# Patient Record
Sex: Female | Born: 1990 | Race: Black or African American | Hispanic: No | Marital: Single | Smoking: Former smoker
Health system: Southern US, Community
[De-identification: ages and names within clinical notes are randomized; demographics above are authoritative.]

---
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
## 2011-02-25 ENCOUNTER — Other Ambulatory Visit: Payer: Self-pay | Admitting: Neurology

## 2011-02-27 ENCOUNTER — Inpatient Hospital Stay: Admission: RE | Admit: 2011-02-27 | Payer: 59 | Source: Ambulatory Visit

## 2011-03-04 ENCOUNTER — Ambulatory Visit
Admission: RE | Admit: 2011-03-04 | Discharge: 2011-03-04 | Disposition: A | Discharge status: Home / Self Care | Payer: 59 | Source: Ambulatory Visit | Attending: Neurology | Admitting: Neurology

## 2011-03-16 ENCOUNTER — Ambulatory Visit: Payer: 59 | Admitting: Emergency Medicine

## 2011-03-25 ENCOUNTER — Encounter: Payer: Self-pay | Admitting: Emergency Medicine

## 2011-03-25 ENCOUNTER — Ambulatory Visit (INDEPENDENT_AMBULATORY_CARE_PROVIDER_SITE_OTHER): Payer: 59 | Admitting: Emergency Medicine

## 2011-03-25 DIAGNOSIS — Z0000 Encounter for general adult medical examination without abnormal findings: Secondary | ICD-10-CM | POA: Insufficient documentation

## 2011-03-25 NOTE — Progress Notes (Signed)
  Subjective:    Patient ID: Stacy Phillips, female    DOB: 12/15/1990, 21 y.o.   MRN: [id number]  HPI Stacy Phillips is here today to establish care.  She does not have any acute concerns.  I have reviewed and updated the following as appropriate: allergies, current medications, past family history, past medical history, past social history, past surgical history and problem list.   Review of Systems Negative except as in HPI.    Objective:   Physical Exam Vitals: reviewed General: pleasant and cooperative young woman, speaks somewhat quickly, tattoos and piercing's present. HEENT: AT/[place], sclera white, EOMI, PERRL, MMM, no pharyngeal erythema or exudate Neck: no LAD, thyroid normal, trachea midline CV: RRR, no murmurs Pulm: CTAB, no wheezes, rales Abd: +BS, soft, non-tender, mildly obese Ext: no edema, good pulses Skin: no rashes or lesions Neuro: gait normal, PERRL     Assessment & Plan:

## 2011-03-25 NOTE — Patient Instructions (Signed)
It was nice to meet you.  If you do not have a period in the next 3 months, I want to see you back in clinic.

## 2011-03-25 NOTE — Assessment & Plan Note (Signed)
Doing well.  Patient taking a medication for herpes; will call office with name and dose of medication.

## 2011-05-11 ENCOUNTER — Ambulatory Visit (INDEPENDENT_AMBULATORY_CARE_PROVIDER_SITE_OTHER): Payer: 59 | Admitting: Family Medicine

## 2011-05-11 ENCOUNTER — Encounter: Payer: Self-pay | Admitting: Family Medicine

## 2011-05-11 VITALS — BP 116/73 | HR 66 | Ht 66.14 in | Wt 173.0 lb

## 2011-05-11 DIAGNOSIS — H109 Unspecified conjunctivitis: Secondary | ICD-10-CM

## 2011-05-11 MED ORDER — POLYMYXIN B-TRIMETHOPRIM 10000-0.1 UNIT/ML-% OP SOLN: 2.0000 [drp] | Freq: Four times a day (QID) | OPHTHALMIC | Status: AC

## 2011-05-11 NOTE — Assessment & Plan Note (Signed)
Symptoms consistent with bacterial infection of right eye. Significant amount of yellow discharge.  Will prescribe antibiotic drops.  Pt to return as needed. Encouraged good hand washing to prevent further spread.

## 2011-05-11 NOTE — Progress Notes (Signed)
  Subjective:    Patient ID: Stacy Phillips, female    DOB: 1990/07/11, 21 y.o.   MRN: [id number]  HPI Right eye redness: Patient reports right eye redness for 2-3 days. Positive yellow discharge. As soon as she cleans out the yellow discharge more discharge will appear. Positive itching and discomfort of thigh. No problems with vision. Has been using warm compresses to eye to help discomfort. Also feels like the eyelids seem sore. Positive sick contact with similar symptoms. No fever. No headache. No rash.    Review of Systems As per above.    Objective:   Physical Exam  HENT:  Head: Normocephalic and atraumatic.  Eyes: EOM are normal. Pupils are equal, round, and reactive to light. Right eye exhibits discharge.       Right conjunctiva red, hypervascular.  + yellow discharge at lid margin.   Cardiovascular: Normal rate.   Pulmonary/Chest: Effort normal.          Assessment & Plan:

## 2011-05-12 ENCOUNTER — Other Ambulatory Visit: Payer: Self-pay | Admitting: Family Medicine

## 2011-05-12 ENCOUNTER — Telehealth: Payer: Self-pay | Admitting: Emergency Medicine

## 2011-05-12 MED ORDER — ERYTHROMYCIN 5 MG/GM OP OINT: TOPICAL_OINTMENT | Freq: Four times a day (QID) | OPHTHALMIC | Status: AC

## 2011-05-12 NOTE — Telephone Encounter (Signed)
Called patient and informed of Rx sent to pharmacy.[place], [place]   

## 2011-05-12 NOTE — Telephone Encounter (Signed)
Was given eye drops yesterday and it says on bottle sulfate and is allergic to sulfur drugs.  Wants to know if this has sulfur in it

## 2011-05-12 NOTE — Telephone Encounter (Signed)
I have sent into pharmacy, erythromycin ointment, this does not have sulfa or pcn in it.  Please call pt and let her know that this has been sent to the pharmacy.  If eye continues to get worse should come in for recheck.

## 2011-05-12 NOTE — Telephone Encounter (Signed)
Returned call to patient.  She has used eye drops and "not sure if eye is worse."  States it appears to be more swollen at the top.  Patient is allergic to sulf and per pharmacist---should d/c eye drops due to sulfa allergy and change to new med.  Will route note to Dr. Edmonia James to change to new Rx for eye drops.  Will call patient back.  Gaylene Brooks, RN

## 2011-06-14 ENCOUNTER — Encounter: Payer: Self-pay | Admitting: Family Medicine

## 2011-06-14 ENCOUNTER — Ambulatory Visit (INDEPENDENT_AMBULATORY_CARE_PROVIDER_SITE_OTHER): Payer: 59 | Admitting: Family Medicine

## 2011-06-14 VITALS — BP 107/72 | HR 96 | Ht 66.0 in | Wt 172.0 lb

## 2011-06-14 DIAGNOSIS — L819 Disorder of pigmentation, unspecified: Secondary | ICD-10-CM

## 2011-06-14 DIAGNOSIS — A6000 Herpesviral infection of urogenital system, unspecified: Secondary | ICD-10-CM

## 2011-06-14 DIAGNOSIS — L709 Acne, unspecified: Secondary | ICD-10-CM

## 2011-06-14 DIAGNOSIS — L708 Other acne: Secondary | ICD-10-CM

## 2011-06-14 DIAGNOSIS — R300 Dysuria: Secondary | ICD-10-CM

## 2011-06-14 MED ORDER — METRONIDAZOLE 1 % EX GEL: Freq: Every day | CUTANEOUS | Status: AC

## 2011-06-14 MED ORDER — LIDOCAINE HCL 2 % EX GEL: CUTANEOUS | Status: AC | PRN

## 2011-06-14 MED ORDER — VALACYCLOVIR HCL 1 G PO TABS: 1000.0000 mg | ORAL_TABLET | Freq: Two times a day (BID) | ORAL | Status: AC

## 2011-06-14 MED ORDER — VALACYCLOVIR HCL 1 G PO TABS: 1000.0000 mg | ORAL_TABLET | Freq: Two times a day (BID) | ORAL | Status: DC

## 2011-06-14 MED ORDER — HYDROCODONE-ACETAMINOPHEN 5-500 MG PO TABS: 1.0000 | ORAL_TABLET | Freq: Four times a day (QID) | ORAL | Status: AC | PRN

## 2011-06-14 MED ORDER — METRONIDAZOLE 1 % EX GEL: Freq: Every day | CUTANEOUS | Status: DC

## 2011-06-14 NOTE — Progress Notes (Signed)
  Subjective:    Patient ID: Stacy Phillips, female    DOB: 1990/06/30, 21 y.o.   MRN: [id number]  HPI Comments: Has h/o HSV and now with similar sx's with outbreak and blisters.  Dysuria  This is a new problem. The current episode started yesterday. The problem occurs every urination. The problem has been gradually worsening. The quality of the pain is described as burning. The pain is moderate. There has been no fever. She is sexually active. There is no history of pyelonephritis. Pertinent negatives include no nausea or vomiting. She has tried increased fluids for the symptoms.      Review of Systems  Constitutional: Negative for fever.  Respiratory: Negative for shortness of breath.   Cardiovascular: Negative for palpitations.  Gastrointestinal: Negative for nausea, vomiting, abdominal pain and diarrhea.  Genitourinary: Positive for dysuria and genital sores. Negative for vaginal bleeding.       Objective:   Physical Exam  Constitutional: She appears well-developed and well-nourished.  HENT:  Head: Normocephalic and atraumatic.  Eyes: No scleral icterus.  Neck: Normal range of motion.  Cardiovascular: Normal rate and regular rhythm.   Pulmonary/Chest: Effort normal.  Abdominal: Soft.          Assessment & Plan:   1. Dysuria    2. Recurrent genital HSV (herpes simplex virus) infection  valACYclovir (VALTREX) 1000 MG tablet, HYDROcodone-acetaminophen (VICODIN) 5-500 MG per tablet, lidocaine (XYLOCAINE JELLY) 2 % jelly, DISCONTINUED: valACYclovir (VALTREX) 1000 MG tablet  3. Hyperpigmentation  Ambulatory referral to Dermatology  4. Acne  metroNIDAZOLE (METROGEL) 1 % gel, DISCONTINUED: metroNIDAZOLE (METROGEL) 1 % gel

## 2011-06-14 NOTE — Patient Instructions (Signed)
Acne  Acne is a skin problem that causes pimples. Acne occurs when the pores in your skin get blocked. Your pores may become red, sore, and swollen (inflamed), or infected with a common skin bacterium (Propionibacterium acnes). Acne is a common skin problem. Up to 80% of people get acne at some time. Acne is especially common from the ages of 12 to 24. Acne usually goes away over time with proper treatment.  CAUSES    Your pores each contain an oil gland. The oil glands make an oily substance called sebum. Acne happens when these glands get plugged with sebum, dead skin cells, and dirt. The P. acnes bacteria that are normally found in the oil glands then multiply, causing inflammation. Acne is commonly triggered by changes in your hormones. These hormonal changes can cause the oil glands to get bigger and to make more sebum. Factors that can make acne worse include:   Hormone changes during adolescence.    Hormone changes during women's menstrual cycles.    Hormone changes during pregnancy.    Oil-based cosmetics and hair products.    Harshly scrubbing the skin.    Strong soaps.    Stress.    Hormone problems due to certain diseases.    Long or oily hair rubbing against the skin.    Certain medicines.    Pressure from headbands, backpacks, or shoulder pads.    Exposure to certain oils and chemicals.   SYMPTOMS    Acne often occurs on the face, neck, chest, and upper back. Symptoms include:   Small, red bumps (pimples or papules).    Whiteheads (closed comedones).    Blackheads (open comedones).    Small, pus-filled pimples (pustules).    Big, red pimples or pustules that feel tender.   More severe acne can cause:   An infected area that contains a collection of pus (abscess).    Hard, painful, fluid-filled sacs (cysts).    Scars.   DIAGNOSIS   Your caregiver can usually tell what the problem is by doing a physical exam.  TREATMENT     There are many good treatments for acne. Some are available over-the-counter and some are available with a prescription. The treatment that is best for you depends on the type of acne you have and how severe it is. It may take 2 months of treatment before your acne gets better. Common treatments include:   Creams and lotions that prevent oil glands from clogging.    Creams and lotions that treat or prevent infections and inflammation.    Antibiotics applied to the skin or taken as a pill.    Pills that decrease sebum production.    Birth control pills.    Light or laser treatments.    Minor surgery.    Injections of medicine into the affected areas.    Chemicals that cause peeling of the skin.   HOME CARE INSTRUCTIONS    Good skin care is the most important part of treatment.   Wash your skin gently at least twice a day and after exercise. Always wash your skin before bed.    Use mild soap.    After each wash, apply a water-based skin moisturizer.    Keep your hair clean and off of your face. Shampoo your hair daily.    Only take medicines as directed by your caregiver.    Use a sunscreen or sunblock with SPF 30 or greater. This is especially important when you are using acne   cause scarring.  SEEK MEDICAL CARE IF:   Your acne is not better after 8 weeks.   Your acne gets worse.   You have a large area of skin that is red or tender.  Document Released: 02/20/2000 Document Revised: 02/11/2011 Document Reviewed: 12/11/2010 [facility] Patient Information 2012 [place], [place].Genital Herpes Genital herpes is a sexually transmitted disease. This means that it is a disease passed by having sex with an infected person. There is no cure for genital herpes. The time between attacks can be  months to years. The virus may live in a person but produce no problems (symptoms). This infection can be passed to a baby as it travels down the birth canal (vagina). In a newborn, this can cause central nervous system damage, eye damage, or even death. The virus that causes genital herpes is usually HSV-2 virus. The virus that causes oral herpes is usually HSV-1. The diagnosis (learning what is wrong) is made through culture results. SYMPTOMS  Usually symptoms of pain and itching begin a few days to a week after contact. It first appears as small blisters that progress to small painful ulcers which then scab over and heal after several days. It affects the outer genitalia, birth canal, cervix, penis, anal area, buttocks, and thighs. HOME CARE INSTRUCTIONS   Keep ulcerated areas dry and clean.   Take medications as directed. Antiviral medications can speed up healing. They will not prevent recurrences or cure this infection. These medications can also be taken for suppression if there are frequent recurrences.   While the infection is active, it is contagious. Avoid all sexual contact during active infections.   Condoms may help prevent spread of the herpes virus.   Practice safe sex.   Wash your hands thoroughly after touching the genital area.   Avoid touching your eyes after touching your genital area.   Inform your caregiver if you have had genital herpes and become pregnant. It is your responsibility to insure a safe outcome for your baby in this pregnancy.   Only take over-the-counter or prescription medicines for pain, discomfort, or fever as directed by your caregiver.  SEEK MEDICAL CARE IF:   You have a recurrence of this infection.   You do not respond to medications and are not improving.   You have new sources of pain or discharge which have changed from the original infection.   You have an oral temperature above 102 F (38.9 C).   You develop abdominal pain.   You  develop eye pain or signs of eye infection.  Document Released: 02/20/2000 Document Revised: 02/11/2011 Document Reviewed: 03/12/2009 [facility] Patient Information 2012 [place], [place].

## 2011-06-15 ENCOUNTER — Other Ambulatory Visit: Payer: Self-pay | Admitting: Emergency Medicine

## 2011-06-15 DIAGNOSIS — A6000 Herpesviral infection of urogenital system, unspecified: Secondary | ICD-10-CM

## 2011-06-15 MED ORDER — ACYCLOVIR 5 % EX OINT: TOPICAL_OINTMENT | CUTANEOUS | Status: AC

## 2011-06-15 NOTE — Telephone Encounter (Signed)
Pt informed about med and derm appt. Stacy Phillips, Stacy Phillips

## 2011-06-15 NOTE — Telephone Encounter (Signed)
The ointment that Stacy Phillips was requesting at her appt yesterday is called Zovirax, she would like that sent to [facility], [phone].  She would also like Dr. Shawnie Pons to call her with her decision.

## 2011-11-12 ENCOUNTER — Ambulatory Visit: Payer: 59 | Admitting: Emergency Medicine

## 2011-11-12 ENCOUNTER — Ambulatory Visit: Payer: 59 | Admitting: Family Medicine

## 2011-11-15 ENCOUNTER — Ambulatory Visit: Payer: 59 | Admitting: Family Medicine

## 2011-11-16 ENCOUNTER — Ambulatory Visit (INDEPENDENT_AMBULATORY_CARE_PROVIDER_SITE_OTHER): Payer: 59 | Admitting: Family Medicine

## 2011-11-16 ENCOUNTER — Encounter: Payer: Self-pay | Admitting: Family Medicine

## 2011-11-16 VITALS — BP 121/77 | HR 74 | Temp 98.7°F | Ht 66.25 in | Wt 165.0 lb

## 2011-11-16 DIAGNOSIS — J069 Acute upper respiratory infection, unspecified: Secondary | ICD-10-CM

## 2011-11-16 MED ORDER — PSEUDOEPHEDRINE HCL 60 MG PO TABS: 60.0000 mg | ORAL_TABLET | Freq: Four times a day (QID) | ORAL | Status: AC | PRN

## 2011-11-16 MED ORDER — IBUPROFEN 600 MG PO TABS: 600.0000 mg | ORAL_TABLET | Freq: Three times a day (TID) | ORAL | Status: AC | PRN

## 2011-11-16 NOTE — Assessment & Plan Note (Signed)
Doesn't appear to be bacterial sinusitis since there has been overall improvement of symptoms and no relapse of symptoms. Also does not have unilateral maxillary tenderness or purulent discharge. This is likely to be of viral upper respiratory infection. Headache likely from increased sinus pressure. Will treat with ibuprofen 600 mg every 6 hours as needed for pain. Also recommended Sudafed for decongestion. Patient also was recommended to use netty Pot for irrigation and moisturizing of the nasal passages. Reviewed infectious red flags with patient. Patient to return to clinic if worsening symptoms, if worsening fever, if worsening cough and inability to eat or drink. Explained to patient that course of viral infection is typically 7-10 days.

## 2011-11-16 NOTE — Patient Instructions (Signed)
You can use saline netty pot or bottle to irrigate the nasal passages. If you start getting worst, if you continue having fever beyond the next 5 days or so, if you start having trouble breathing or if your energy doesn't pick up at all, come back.   Upper Respiratory Infection, Adult An upper respiratory infection (URI) is also sometimes known as the common cold. The upper respiratory tract includes the nose, sinuses, throat, trachea, and bronchi. Bronchi are the airways leading to the lungs. Most people improve within 1 week, but symptoms can last up to 2 weeks. A residual cough may last even longer.  CAUSES Many different viruses can infect the tissues lining the upper respiratory tract. The tissues become irritated and inflamed and often become very moist. Mucus production is also common. A cold is contagious. You can easily spread the virus to others by oral contact. This includes kissing, sharing a glass, coughing, or sneezing. Touching your mouth or nose and then touching a surface, which is then touched by another person, can also spread the virus. SYMPTOMS  Symptoms typically develop 1 to 3 days after you come in contact with a cold virus. Symptoms vary from person to person. They may include:  Runny nose.   Sneezing.   Nasal congestion.   Sinus irritation.   Sore throat.   Loss of voice (laryngitis).   Cough.   Fatigue.   Muscle aches.   Loss of appetite.   Headache.   Low-grade fever.  DIAGNOSIS  You might diagnose your own cold based on familiar symptoms, since most people get a cold 2 to 3 times a year. Your caregiver can confirm this based on your exam. Most importantly, your caregiver can check that your symptoms are not due to another disease such as strep throat, sinusitis, pneumonia, asthma, or epiglottitis. Blood tests, throat tests, and X-rays are not necessary to diagnose a common cold, but they may sometimes be helpful in excluding other more serious diseases.  Your caregiver will decide if any further tests are required. RISKS AND COMPLICATIONS  You may be at risk for a more severe case of the common cold if you smoke cigarettes, have chronic heart disease (such as heart failure) or lung disease (such as asthma), or if you have a weakened immune system. The very young and very old are also at risk for more serious infections. Bacterial sinusitis, middle ear infections, and bacterial pneumonia can complicate the common cold. The common cold can worsen asthma and chronic obstructive pulmonary disease (COPD). Sometimes, these complications can require emergency medical care and may be life-threatening. PREVENTION  The best way to protect against getting a cold is to practice good hygiene. Avoid oral or hand contact with people with cold symptoms. Wash your hands often if contact occurs. There is no clear evidence that vitamin C, vitamin E, echinacea, or exercise reduces the chance of developing a cold. However, it is always recommended to get plenty of rest and practice good nutrition. TREATMENT  Treatment is directed at relieving symptoms. There is no cure. Antibiotics are not effective, because the infection is caused by a virus, not by bacteria. Treatment may include:  Increased fluid intake. Sports drinks offer valuable electrolytes, sugars, and fluids.   Breathing heated mist or steam (vaporizer or shower).   Eating chicken soup or other clear broths, and maintaining good nutrition.   Getting plenty of rest.   Using gargles or lozenges for comfort.   Controlling fevers with ibuprofen or acetaminophen  as directed by your caregiver.   Increasing usage of your inhaler if you have asthma.  Zinc gel and zinc lozenges, taken in the first 24 hours of the common cold, can shorten the duration and lessen the severity of symptoms. Pain medicines may help with fever, muscle aches, and throat pain. A variety of non-prescription medicines are available to treat  congestion and runny nose. Your caregiver can make recommendations and may suggest nasal or lung inhalers for other symptoms.  HOME CARE INSTRUCTIONS   Only take over-the-counter or prescription medicines for pain, discomfort, or fever as directed by your caregiver.   Use a warm mist humidifier or inhale steam from a shower to increase air moisture. This may keep secretions moist and make it easier to breathe.   Drink enough water and fluids to keep your urine clear or pale yellow.   Rest as needed.   Return to work when your temperature has returned to normal or as your caregiver advises. You may need to stay home longer to avoid infecting others. You can also use a face mask and careful hand washing to prevent spread of the virus.  SEEK MEDICAL CARE IF:   After the first few days, you feel you are getting worse rather than better.   You need your caregiver's advice about medicines to control symptoms.   You develop chills, worsening shortness of breath, or brown or red sputum. These may be signs of pneumonia.   You develop yellow or brown nasal discharge or pain in the face, especially when you bend forward. These may be signs of sinusitis.   You develop a fever, swollen neck glands, pain with swallowing, or white areas in the back of your throat. These may be signs of strep throat.  SEEK IMMEDIATE MEDICAL CARE IF:   You have a fever.   You develop severe or persistent headache, ear pain, sinus pain, or chest pain.   You develop wheezing, a prolonged cough, cough up blood, or have a change in your usual mucus (if you have chronic lung disease).   You develop sore muscles or a stiff neck.  Document Released: 08/18/2000 Document Revised: 02/11/2011 Document Reviewed: 06/26/2010 [facility] Patient Information 2012 [place], [place].

## 2011-11-16 NOTE — Progress Notes (Signed)
Patient ID: Stacy Phillips, female   DOB: 01/23/1991, 21 y.o.   MRN: [id number] --- Subjective:  Stacy Phillips is a 21 y.o.female who presents with one week of nasal congestion, headache, cough. Symptoms started early last week. She reports having a pressure in her head around the frontal and temporal aspects. Also complains of productive cough, productive of phlegm. Had sore throat earlier on in the week but this has since subsided. She had a nosebleed earlier on in the week but has not had any in the last few days. Reports associated low appetite and difficulty tasting food. Also having congestion and runny nose. Feels like she might have had a fever given the fact that she felt chilled and sweaty at night. She reports taking cough drops, over-the-counter flu therapy, NyQuil, DayQuil, nasal spray which all did not help much. However, she does feel that she feels better overall since the symptoms started.  ROS: see HPI Past Medical History: reviewed and updated medications and allergies. Social History: Tobacco: 0.1 pack per day.  Objective: Filed Vitals:   11/16/11 0955  BP: 121/77  Pulse: 74  Temp: 98.7 F (37.1 C)    Physical Examination:   General appearance - alert, well appearing, and in no distress Ears - bilateral TM's and external ear canals normal Nose -erythematous and congested nasal turbinates bilaterally, no maxillary sinus tenderness bilaterally, no unilateral discharge. Head - no frontal sinus tenderness Neuro-cranial nerves II through XII grossly intact, 5 over 5 strength in upper extremities bilaterally. Mouth - erythematous oropharynx, but no tonsillar exudate. Neck - supple, no significant adenopathy Chest - clear to auscultation, no wheezes, rales or rhonchi, symmetric air entry Heart - normal rate, regular rhythm, normal S1, S2, no murmurs, rubs, clicks or gallops Extremities - peripheral pulses normal, no pedal edema

## 2012-01-19 ENCOUNTER — Ambulatory Visit: Payer: 59 | Admitting: Family Medicine

## 2012-02-08 ENCOUNTER — Ambulatory Visit (INDEPENDENT_AMBULATORY_CARE_PROVIDER_SITE_OTHER): Payer: 59 | Admitting: Emergency Medicine

## 2012-02-08 DIAGNOSIS — Z0000 Encounter for general adult medical examination without abnormal findings: Secondary | ICD-10-CM

## 2012-02-08 NOTE — Progress Notes (Signed)
  Subjective:    Patient ID: Stacy Phillips, female    DOB: 1990-07-19, 21 y.o.   MRN: [id number]  HPI Encounter opened in error.  Patient did not show for appointment.   Review of Systems     Objective:   Physical Exam        Assessment & Plan:

## 2012-08-31 ENCOUNTER — Other Ambulatory Visit: Payer: 59

## 2012-10-05 ENCOUNTER — Other Ambulatory Visit (HOSPITAL_COMMUNITY)
Admission: RE | Admit: 2012-10-05 | Discharge: 2012-10-05 | Disposition: A | Discharge status: Home / Self Care | Payer: 59 | Source: Ambulatory Visit | Attending: Family Medicine | Admitting: Family Medicine

## 2012-10-05 ENCOUNTER — Ambulatory Visit (INDEPENDENT_AMBULATORY_CARE_PROVIDER_SITE_OTHER): Payer: 59 | Admitting: Family Medicine

## 2012-10-05 ENCOUNTER — Encounter: Payer: Self-pay | Admitting: Family Medicine

## 2012-10-05 VITALS — BP 105/67 | HR 51 | Temp 98.4°F | Wt 140.0 lb

## 2012-10-05 DIAGNOSIS — L989 Disorder of the skin and subcutaneous tissue, unspecified: Secondary | ICD-10-CM | POA: Insufficient documentation

## 2012-10-05 DIAGNOSIS — Z113 Encounter for screening for infections with a predominantly sexual mode of transmission: Secondary | ICD-10-CM | POA: Insufficient documentation

## 2012-10-05 DIAGNOSIS — R21 Rash and other nonspecific skin eruption: Secondary | ICD-10-CM

## 2012-10-05 DIAGNOSIS — N898 Other specified noninflammatory disorders of vagina: Secondary | ICD-10-CM

## 2012-10-05 DIAGNOSIS — N760 Acute vaginitis: Secondary | ICD-10-CM | POA: Insufficient documentation

## 2012-10-05 LAB — POCT WET PREP (WET MOUNT): Clue Cells Wet Prep Whiff POC: POSITIVE

## 2012-10-05 MED ORDER — METRONIDAZOLE 500 MG PO TABS: 500.0000 mg | ORAL_TABLET | Freq: Two times a day (BID) | ORAL | Status: DC

## 2012-10-05 MED ORDER — FLUCONAZOLE 100 MG PO TABS: ORAL_TABLET | ORAL | Status: DC

## 2012-10-05 MED ORDER — MUPIROCIN CALCIUM 2 % EX CREA: TOPICAL_CREAM | Freq: Three times a day (TID) | CUTANEOUS | Status: DC

## 2012-10-05 NOTE — Patient Instructions (Addendum)
I think you have a yeast infection. Take one tab today, and if symptoms do not improve take a second tab.  You have folliculitis or infected hairs which will go away on their own. Make sure you use a sharp razor.  For your arms, use Bactroban cream up to 3 times per day until it goes away.  Brayen Bunn M. Jalyric Kaestner, M.D.   Vaginitis Vaginitis in a soreness, swelling and redness (inflammation) of the vagina and vulva. This is not a sexually transmitted infection.  CAUSES  Yeast vaginitis is caused by yeast (candida) that is normally found in your vagina. With a yeast infection, the candida has over grown in number to a point that upsets the chemical balance. SYMPTOMS   White thick vaginal discharge.   Swelling, itching, redness and irritation of the vagina and possibly the lips of the vagina (vulva).   Burning or painful urination.   Painful intercourse.  HOME CARE INSTRUCTIONS   Finish all medication as prescribed.   Do not have sex until treatment is completed or instructed by your healthcare giver.   Take warm sitz baths.   Do not douche.   Do not use tampons, especially scented ones.   Wear cotton underwear.   Avoid tight pants and panty hose.   Tell your sexual partner that you have a yeast infection. They should go to their caregiver if they have symptoms such as mild rash or itching.   Your sexual partner should be treated if your infection is difficult to eliminate.   Practice safer sex. Use condoms.   Some vaginal medications cause latex condoms to fail. Ask your caregiver this.  SEEK MEDICAL CARE IF:   You develop a fever.   The infection is getting worse after 2 days of treatment.   The infection is not getting better after 3 days of treatment.   You develop blisters in or around your vagina.   You develop vaginal bleeding, and it is not your menstrual period.   You have pain when you urinate.   You develop intestinal problems.   You have pain with  sexual intercourse.  Document Released: 04/01/2004 Document Revised: 02/11/2011 Document Reviewed: 11/07/2008 [facility] Patient Information 2012 [place], [place].

## 2012-10-05 NOTE — Assessment & Plan Note (Addendum)
Characteristically appears to be yeast. Will treat with Diflucan once today and repeat in 48 hours if not resolved.  Hyperpigmentation appears normal, with no concern. Patient reassured. Folliculitis without signs of abscess, will monitor for now.  Addendum: Wet prep shows BV. Patient informed and Rx for Flagyl sent to pharmacy. Patient informed to avoid all alcohol while taking medication.

## 2012-10-05 NOTE — Assessment & Plan Note (Signed)
Pustules could be suggestive of MRSA, especially if she has history. Will treat with Bactroban cream TID until resolved. Advised not to use on groin.

## 2012-10-05 NOTE — Progress Notes (Signed)
Patient ID: Stacy Phillips, female   DOB: [birth year], 22 y.o.   MRN: [id number]  [facility] Iara Monds M. Latima Hamza, MD Phone: [phone]   Subjective: HPI: Patient is a 22 y.o. female presenting to clinic today for same day appointment for vaginal itching and discharge.  Vaginitis Patient presents for evaluation of an abnormal vaginal discharge. Symptoms have been present for 3 months. Vaginal symptoms: discharge described as white, local irritation and odor. Contraception: condoms. She denies abnormal bleeding and blisters. Sexually transmitted infection risk: possibly herpes simplex virus infection in the past, per patient report she was "supposed to be taking medicine" but she is not sure what for. Menstrual flow: irregular.  Last intercourse 1 month ago, LMP 3 weeks ago. Patient is also concerned about "bumps under the skin" and hyperpigmentation of her vulva.  Rash Patient reports bumps on her upper arms. Not sure how long they have been there. Not painful or itchy.   History Reviewed: Everyday smoker. Health Maintenance: UTD for age  ROS: Please see HPI above.  Objective: Office vital signs reviewed. There were no vitals taken for this visit.  Physical Examination:  General: Awake, alert. NAD.  HEENT: Atraumatic, normocephalic. MMM Pulm: CTAB, no wheezes Cardio: RRR, no murmurs appreciated Abdomen: soft, nontender, nondistended GU: Folliculitis of external vulva. No other lesions noted. Mild hyperpigmentation but does not appear abnormal. Thick vaginal discharge in vaginal vault. Cervix appears wnl. No CMT. No adnexal tenderness  Extremities: No edema. Raised, slightly erythematous pustules on upper extremities bilaterally without any drainage. Neuro: Grossly intact  Assessment: 22 y.o. female same day  Plan: See Problem List and After Visit Summary

## 2012-11-10 ENCOUNTER — Telehealth: Payer: Self-pay | Admitting: Emergency Medicine

## 2012-11-10 NOTE — Telephone Encounter (Signed)
Please get more info.

## 2012-11-10 NOTE — Telephone Encounter (Signed)
Pt would like a nurse or Dr. Mikel Cella to call her concerning the medication she is on. JW

## 2012-12-22 ENCOUNTER — Encounter: Payer: Self-pay | Admitting: Emergency Medicine

## 2012-12-22 ENCOUNTER — Ambulatory Visit (INDEPENDENT_AMBULATORY_CARE_PROVIDER_SITE_OTHER): Payer: 59 | Admitting: Emergency Medicine

## 2012-12-22 VITALS — BP 108/71 | HR 75 | Temp 98.2°F | Wt 148.0 lb

## 2012-12-22 DIAGNOSIS — N760 Acute vaginitis: Secondary | ICD-10-CM

## 2012-12-22 DIAGNOSIS — R21 Rash and other nonspecific skin eruption: Secondary | ICD-10-CM

## 2012-12-22 LAB — POCT WET PREP (WET MOUNT): Clue Cells Wet Prep Whiff POC: POSITIVE

## 2012-12-22 MED ORDER — FLUCONAZOLE 100 MG PO TABS: ORAL_TABLET | ORAL | Status: DC

## 2012-12-22 MED ORDER — METRONIDAZOLE 500 MG PO TABS: 500.0000 mg | ORAL_TABLET | Freq: Two times a day (BID) | ORAL | Status: DC

## 2012-12-22 NOTE — Assessment & Plan Note (Addendum)
Wet prep shows BV and yeast. No CMT or adnexal pain/fullness. Flagyl 500mg  BID x7 days. Diflucan 150mg  x1 to take after finishing antibiotics.

## 2012-12-22 NOTE — Assessment & Plan Note (Addendum)
Concern for herpes given description and possible history of exposure. Will check HSV I and II IGG antibodies.  Will call [phone] with results next week.  If positive, would consider Valtrex for future outbreaks.

## 2012-12-22 NOTE — Progress Notes (Signed)
  Subjective:    Patient ID: Stacy Phillips, female    DOB: 01/25/1991, 22 y.o.   MRN: [id number]  HPI SAMAYAH NOVINGER is here for a vaginal discharge and rash.  She reports that 1 week ago she noticed a clump of bumps on her vulva.  She states that this is the first time she has ever had these bumps, but a sexual partner several years ago had very similar bumps.  She states she might have a history of herpes, but doesn't really know.  The area now is just has some dried skin.  There may have been some burning associated with bumps.  She also reports a think white vaginal discharge with foul odor during the same time period.  Reports vaginal "irritation" and some itching.  No new products, sexual partners.  Does not douche.  Denies pelvis or abdominal pain.  No fevers or chills.  I have reviewed and updated the following as appropriate: allergies and current medications SHx: current smoker  Review of Systems See HPI    Objective:   Physical Exam BP 108/71  Pulse 75  Temp(Src) 98.2 F (36.8 C) (Oral)  Wt 148 lb (67.132 kg)  BMI 23.7 kg/m2 Gen: alert, cooperative, NAD Pelvic: normal external genitalia, does have some peeling skin on vulva in a pattern consistent with prior vesicles being present; normal vagina with moderate amount of white discharge; normal cervix; no CMT; no adnexal tenderness or fullness      Assessment & Plan:

## 2012-12-22 NOTE — Patient Instructions (Signed)
It was nice to see you!  You have bacterial vaginosis. Take the flagyl 1 pill twice a day for 7 days. Once you finish all the flagyl, take the diflucan to prevent yeast infection.  We are checking some blood work today for herpes.  I will call you with the results next week.  Follow up if things do not improve with the medicine.  Bacterial Vaginosis Bacterial vaginosis is an infection of the vagina. A healthy vagina has many kinds of good germs (bacteria). Sometimes the number of good germs can change. This allows bad germs to move in and cause an infection. You may be given medicine (antibiotics) to treat the infection. Or, you may not need treatment at all. HOME CARE  Take your medicine as told. Finish them even if you start to feel better.  Do not have sex until you finish your medicine.  Do not douche.  Practice safe sex.  Tell your sex partner that you have an infection. They should see their doctor for treatment if they have problems. GET HELP RIGHT AWAY IF:  You do not get better after 3 days of treatment.  You have grey fluid (discharge) coming from your vagina.  You have pain.  You have a temperature of 102 F (38.9 C) or higher. MAKE SURE YOU:   Understand these instructions.  Will watch your condition.  Will get help right away if you are not doing well or get worse. Document Released: 12/02/2007 Document Revised: 05/17/2011 Document Reviewed: 12/02/2007 [facility] Patient Information 2014 [place], [place].

## 2012-12-25 LAB — HSV 1 ANTIBODY, IGG: HSV 1 Glycoprotein G Ab, IgG: 10.53 IV — ABNORMAL HIGH

## 2012-12-25 LAB — HSV 2 ANTIBODY, IGG: HSV 2 Glycoprotein G Ab, IgG: <0.1 IV

## 2012-12-26 ENCOUNTER — Telehealth: Payer: Self-pay | Admitting: Emergency Medicine

## 2012-12-26 NOTE — Telephone Encounter (Signed)
Called and left message for patient requesting that she call the office and leave a time that would be good for me to call her about her lab results.  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

## 2013-01-08 ENCOUNTER — Ambulatory Visit: Payer: 59 | Admitting: Emergency Medicine

## 2013-01-22 MED ORDER — VALACYCLOVIR HCL 1 G PO TABS: 1000.0000 mg | ORAL_TABLET | Freq: Two times a day (BID) | ORAL | Status: DC

## 2013-01-22 NOTE — Telephone Encounter (Signed)
Called and discussed results with patient.  Will try valtrex for outbreaks.  She will f/u in a few months to discuss further.

## 2013-01-22 NOTE — Telephone Encounter (Signed)
Patient calls, inquiring labs from last month. Advised patient that MD did in fact call her on the 10/21. Advised patient that MD will call back with results. Please call patient .

## 2013-01-22 NOTE — Addendum Note (Signed)
Addended by: Charm Rings on: 01/22/2013 05:10 PM   Modules accepted: Orders

## 2013-06-25 ENCOUNTER — Telehealth: Payer: Self-pay | Admitting: Emergency Medicine

## 2013-06-25 NOTE — Telephone Encounter (Signed)
Pt wanted to know if she could get a shot for her allergies that the OTC is not working.  I told pt. She needs to make an OV with MD to discuss allergies.  Radene OuKristen L Makaylie Dedeaux, CMA

## 2013-06-25 NOTE — Telephone Encounter (Signed)
Pt called and would like Dr. Piedad ClimesHonig to call her. She didn't want to go into details. jw

## 2013-07-13 ENCOUNTER — Encounter: Payer: Self-pay | Admitting: Emergency Medicine

## 2013-07-13 ENCOUNTER — Ambulatory Visit: Payer: 59 | Admitting: Family Medicine

## 2013-07-13 ENCOUNTER — Ambulatory Visit (INDEPENDENT_AMBULATORY_CARE_PROVIDER_SITE_OTHER): Payer: 59 | Admitting: Emergency Medicine

## 2013-07-13 VITALS — BP 122/75 | HR 85 | Temp 99.0°F | Wt 148.0 lb

## 2013-07-13 DIAGNOSIS — R21 Rash and other nonspecific skin eruption: Secondary | ICD-10-CM

## 2013-07-13 MED ORDER — TRIAMCINOLONE ACETONIDE 0.1 % EX CREA: 1.0000 "application " | TOPICAL_CREAM | Freq: Two times a day (BID) | CUTANEOUS | Status: DC

## 2013-07-13 NOTE — Assessment & Plan Note (Signed)
Exam consistent with a reactive dermatitis.  No known allergens or exposure. Zyrtec 10mg  daily x1 week. Triamcinolone cream 0.1% BID x2 weeks. Use sunscreen. F/u if not improved in 1-2 weeks.

## 2013-07-13 NOTE — Patient Instructions (Signed)
It was nice to see you!  You have an allergic reaction on your face. Take Zyrtec daily for the next week. Use the triamcinolone cream twice a day for the next week. Apply sunscreen before going outside. Try not to scratch at your face.  Follow up in 1-2 weeks, if not improved. Otherwise, I will see you back in 2-4 weeks to talk about your other concerns.

## 2013-07-13 NOTE — Progress Notes (Signed)
   Subjective:    Patient ID: Stacy MessickQumeisha T Kerrick, female    DOB: 1990/11/11, 23 y.o.   MRN: [id number]  HPI Stacy Phillips is here for a same-day appointment for facial rash.  She reports a face rash for the last 3 days or so. It is itchy. Every time she scratches more bumps pop up. She denies any new lotions or creams that she uses on her face. No new makeup products. Denies increased sun exposure. She tried OTC hydrocortisone cream last night which helped some with the itching. Denies rash elsewhere. She has taken Zyrtec intermittently for the last 3 or 4 months for allergies.  Current Outpatient Prescriptions on File Prior to Visit  Medication Sig Dispense Refill  . valACYclovir (VALTREX) 1000 MG tablet Take 1 tablet (1,000 mg total) by mouth 2 (two) times daily.  20 tablet  0   No current facility-administered medications on file prior to visit.    I have reviewed and updated the following as appropriate: allergies and current medications SHx: current smoker   Review of Systems See HPI    Objective:   Physical Exam BP 122/75  Pulse 85  Temp(Src) 99 F (37.2 C) (Oral)  Wt 148 lb (67.132 kg) Gen: alert, cooperative, NAD Skin: flesh tone papular rash on face, primarily on cheeks and temples      Assessment & Plan:

## 2013-08-17 ENCOUNTER — Encounter: Payer: Self-pay | Admitting: Family Medicine

## 2013-08-17 ENCOUNTER — Ambulatory Visit (INDEPENDENT_AMBULATORY_CARE_PROVIDER_SITE_OTHER): Payer: 59 | Admitting: Family Medicine

## 2013-08-17 VITALS — BP 126/75 | HR 77 | Temp 97.0°F | Ht 66.0 in | Wt 148.1 lb

## 2013-08-17 DIAGNOSIS — S339XXA Sprain of unspecified parts of lumbar spine and pelvis, initial encounter: Secondary | ICD-10-CM

## 2013-08-17 DIAGNOSIS — M79671 Pain in right foot: Secondary | ICD-10-CM | POA: Insufficient documentation

## 2013-08-17 DIAGNOSIS — M79609 Pain in unspecified limb: Secondary | ICD-10-CM

## 2013-08-17 DIAGNOSIS — S39012A Strain of muscle, fascia and tendon of lower back, initial encounter: Secondary | ICD-10-CM | POA: Insufficient documentation

## 2013-08-17 DIAGNOSIS — S335XXA Sprain of ligaments of lumbar spine, initial encounter: Secondary | ICD-10-CM

## 2013-08-17 MED ORDER — CYCLOBENZAPRINE HCL 10 MG PO TABS: 10.0000 mg | ORAL_TABLET | Freq: Every evening | ORAL | Status: DC | PRN

## 2013-08-17 MED ORDER — MELOXICAM 15 MG PO TABS: 15.0000 mg | ORAL_TABLET | Freq: Every day | ORAL | Status: DC

## 2013-08-17 NOTE — Assessment & Plan Note (Signed)
Treat symptomatically with meloxicam and Flexeril
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
## 2013-08-17 NOTE — Patient Instructions (Signed)
Dear Stacy Phillips,   Thank you for coming to clinic today. Please read below regarding the issues that we discussed.   1. Back Pain - You have strained muscles in your lower back. You need to take the meloxicam daily for the next 5-7 days. Also use the flexeril as needed at night. Do not drive while taking flexeril. You should also use heating pads and massage for this issue.   2. Right Foot Pain - I would like to get an X-ray to make sure it is not fracture. [facility].   I hope you feel better soon.   Please follow up in clinic in 2 weeks if needed.   Sincerely,   Dr. Clinton SawyerWilliamson

## 2013-08-19 ENCOUNTER — Ambulatory Visit (INDEPENDENT_AMBULATORY_CARE_PROVIDER_SITE_OTHER): Payer: 59 | Admitting: Emergency Medicine

## 2013-08-19 ENCOUNTER — Ambulatory Visit (INDEPENDENT_AMBULATORY_CARE_PROVIDER_SITE_OTHER): Payer: 59

## 2013-08-19 VITALS — BP 114/82 | HR 100 | Temp 98.1°F | Resp 16 | Ht 66.0 in

## 2013-08-19 DIAGNOSIS — S335XXA Sprain of ligaments of lumbar spine, initial encounter: Secondary | ICD-10-CM

## 2013-08-19 DIAGNOSIS — M79609 Pain in unspecified limb: Secondary | ICD-10-CM

## 2013-08-19 DIAGNOSIS — M79673 Pain in unspecified foot: Secondary | ICD-10-CM

## 2013-08-19 DIAGNOSIS — S9030XA Contusion of unspecified foot, initial encounter: Secondary | ICD-10-CM

## 2013-08-19 DIAGNOSIS — M549 Dorsalgia, unspecified: Secondary | ICD-10-CM

## 2013-08-19 NOTE — Patient Instructions (Signed)
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
## 2013-08-20 ENCOUNTER — Telehealth: Payer: Self-pay

## 2013-08-20 NOTE — Telephone Encounter (Signed)
Patient states she was prescribed medications from her visit yesterday. Advised patient there were no notes from where anything was prescribed from yesterdays visit. Patient does not know the name of medications that were supposed to be prescribed.

## 2013-08-20 NOTE — Telephone Encounter (Signed)
Pt is supposed to take the meds that Dr. Clinton SawyerWilliamson gave her (see EPIC notes). She said that she didn't get anything. Told her that she would have to call their office.

## 2013-08-21 ENCOUNTER — Telehealth: Payer: Self-pay | Admitting: *Deleted

## 2013-08-21 DIAGNOSIS — S39012A Strain of muscle, fascia and tendon of lower back, initial encounter: Secondary | ICD-10-CM

## 2013-08-21 MED ORDER — MELOXICAM 15 MG PO TABS: 15.0000 mg | ORAL_TABLET | Freq: Every day | ORAL | Status: DC

## 2013-08-21 MED ORDER — CYCLOBENZAPRINE HCL 10 MG PO TABS: 10.0000 mg | ORAL_TABLET | Freq: Every evening | ORAL | Status: AC | PRN

## 2013-08-21 NOTE — Telephone Encounter (Signed)
Pt stated she did not received Rx for Mobic and Flexeril from office visit 08/17/2013.  Rx stated printed.  Per Dr. Deirdre Priesthambliss ok to reorder.  Clovis PuMartin, Tamika L, RN

## 2013-11-07 ENCOUNTER — Ambulatory Visit: Payer: 59 | Admitting: Family Medicine

## 2013-12-12 ENCOUNTER — Ambulatory Visit: Payer: 59 | Admitting: Family Medicine

## 2013-12-21 ENCOUNTER — Ambulatory Visit (INDEPENDENT_AMBULATORY_CARE_PROVIDER_SITE_OTHER): Payer: 59 | Admitting: Family Medicine

## 2013-12-21 ENCOUNTER — Encounter: Payer: Self-pay | Admitting: Family Medicine

## 2013-12-21 VITALS — BP 125/71 | HR 81 | Temp 98.1°F | Wt 142.0 lb

## 2013-12-21 DIAGNOSIS — G43009 Migraine without aura, not intractable, without status migrainosus: Secondary | ICD-10-CM

## 2013-12-21 DIAGNOSIS — G43909 Migraine, unspecified, not intractable, without status migrainosus: Secondary | ICD-10-CM | POA: Insufficient documentation

## 2013-12-21 MED ORDER — PROPRANOLOL HCL ER 80 MG PO CP24: 80.0000 mg | ORAL_CAPSULE | Freq: Every day | ORAL | Status: DC

## 2013-12-21 NOTE — Patient Instructions (Signed)
To prevent migraines I am starting a medication called propranolol. In 3-4 weeks if it is not helping enough, we can increase the dose.   Thank you.

## 2014-01-02 ENCOUNTER — Encounter: Payer: Self-pay | Admitting: Family Medicine

## 2014-01-02 NOTE — Assessment & Plan Note (Signed)
Long history of migraines starting in childhood. Having daily for the past week and more frequently before that. Wants to prevent - start propranolol

## 2014-01-02 NOTE — Progress Notes (Signed)
   Subjective:    Patient ID: Stacy Phillips, female    DOB: [birth year], 23 y.o.   MRN: [id number]  Headache    HEADACHE   Onset: 1 week  Location: bitemporal Quality: pounding/aching Frequency: daily Precipitating factors: sound, light, certain foods Prior treatment: ibuprofen  Associated Symptoms Nausea/vomiting: yes  Photophobia/phonophobia: yes  Tearing of eyes: no  Sinus pain/pressure: no  Family hx migraine: yes  Personal stressors: no  Relation to menstrual cycle: no   Red Flags Fever: no  Neck pain/stiffness: no  Vision/speech/swallow/hearing difficulty: no  Focal weakness/numbness: no  Altered mental status: no  Trauma: no  New type of headache: no  Anticoagulant use: no  H/o cancer/HIV/Pregnancy: no     Review of Systems  Neurological: Positive for headaches.   See HPI    Objective:   Physical Exam  Nursing note and vitals reviewed. Constitutional: She is oriented to person, place, and time. She appears well-developed and well-nourished. No distress.  HENT:  Head: Normocephalic and atraumatic.  Nose: Nose normal. Right sinus exhibits no maxillary sinus tenderness and no frontal sinus tenderness. Left sinus exhibits no maxillary sinus tenderness and no frontal sinus tenderness.  Eyes: Conjunctivae and EOM are normal. Pupils are equal, round, and reactive to light. Right eye exhibits no discharge. Left eye exhibits no discharge. No scleral icterus.  Cardiovascular: Normal rate.   Pulmonary/Chest: Effort normal.  Abdominal: There is no tenderness.  Neurological: She is alert and oriented to person, place, and time. No cranial nerve deficit. Coordination normal.  Skin: Skin is warm and dry. No rash noted. She is not diaphoretic.  Psychiatric: She has a normal mood and affect. Her behavior is normal.          Assessment & Plan:

## 2014-01-16 ENCOUNTER — Ambulatory Visit: Payer: 59 | Admitting: Family Medicine

## 2014-08-13 ENCOUNTER — Other Ambulatory Visit (HOSPITAL_COMMUNITY)
Admission: RE | Admit: 2014-08-13 | Discharge: 2014-08-13 | Disposition: A | Discharge status: Home / Self Care | Payer: 59 | Source: Ambulatory Visit | Attending: Family Medicine | Admitting: Family Medicine

## 2014-08-13 ENCOUNTER — Encounter: Payer: Self-pay | Admitting: Family Medicine

## 2014-08-13 ENCOUNTER — Ambulatory Visit (INDEPENDENT_AMBULATORY_CARE_PROVIDER_SITE_OTHER): Payer: 59 | Admitting: Family Medicine

## 2014-08-13 VITALS — Temp 97.9°F | Wt 149.0 lb

## 2014-08-13 DIAGNOSIS — Z124 Encounter for screening for malignant neoplasm of cervix: Secondary | ICD-10-CM

## 2014-08-13 DIAGNOSIS — L810 Postinflammatory hyperpigmentation: Secondary | ICD-10-CM | POA: Diagnosis not present

## 2014-08-13 DIAGNOSIS — B9689 Other specified bacterial agents as the cause of diseases classified elsewhere: Secondary | ICD-10-CM | POA: Insufficient documentation

## 2014-08-13 DIAGNOSIS — N760 Acute vaginitis: Secondary | ICD-10-CM

## 2014-08-13 DIAGNOSIS — N898 Other specified noninflammatory disorders of vagina: Secondary | ICD-10-CM

## 2014-08-13 DIAGNOSIS — Z113 Encounter for screening for infections with a predominantly sexual mode of transmission: Secondary | ICD-10-CM | POA: Insufficient documentation

## 2014-08-13 DIAGNOSIS — Z0000 Encounter for general adult medical examination without abnormal findings: Secondary | ICD-10-CM | POA: Insufficient documentation

## 2014-08-13 DIAGNOSIS — Z01419 Encounter for gynecological examination (general) (routine) without abnormal findings: Secondary | ICD-10-CM | POA: Insufficient documentation

## 2014-08-13 DIAGNOSIS — A499 Bacterial infection, unspecified: Secondary | ICD-10-CM

## 2014-08-13 LAB — POCT URINE PREGNANCY: PREG TEST UR: NEGATIVE

## 2014-08-13 LAB — POCT WET PREP (WET MOUNT): Clue Cells Wet Prep Whiff POC: POSITIVE

## 2014-08-13 MED ORDER — METRONIDAZOLE 500 MG PO TABS: 500.0000 mg | ORAL_TABLET | Freq: Two times a day (BID) | ORAL | Status: DC

## 2014-08-13 NOTE — Patient Instructions (Signed)
Thank you for coming to the clinic today. It was nice seeing you.  You have bacterial vaginosis. This is caused by bacterial overgrowth. We will treat this with a week of antibiotics.  The dark spots on your skin are a result of a condition called "post-inflammatory" hyperpigmentation. There is not a lot that we can offer to reduce the appearance of these spots. Preventing inflammation is the main way to treat this condition. We will refer you to dermatology for further management.  We obtained a pap smear today. We will call you with the results.    Bacterial Vaginosis Bacterial vaginosis is an infection of the vagina. It happens when too many of certain germs (bacteria) grow in the vagina. HOME CARE  Take your medicine as told by your doctor.  Finish your medicine even if you start to feel better.  Do not have sex until you finish your medicine and are better.  Tell your sex partner that you have an infection. They should see their doctor for treatment.  Practice safe sex. Use condoms. Have only one sex partner. GET HELP IF:  You are not getting better after 3 days of treatment.  You have more grey fluid (discharge) coming from your vagina than before.  You have more pain than before.  You have a fever. MAKE SURE YOU:   Understand these instructions.  Will watch your condition.  Will get help right away if you are not doing well or get worse. Document Released: 12/02/2007 Document Revised: 12/13/2012 Document Reviewed: 10/04/2012 [facility] Patient Information 2015 [place]. This information is not intended to replace advice given to you by your health care provider. Make sure you discuss any questions you have with your health care provider.

## 2014-08-13 NOTE — Assessment & Plan Note (Signed)
Pap sent today. Patient desired to be called or have VM left with results.

## 2014-08-13 NOTE — Assessment & Plan Note (Addendum)
Wet prep with clue cells. Will treat with 7 day course of flagyl.  UPT negative GC/GT pending.

## 2014-08-13 NOTE — Assessment & Plan Note (Signed)
Areas of discoloration in groin consistent with postinflammatory hyperpigmentations secondary to folliculitis. Educated patient about typical course and duration of skin changes. Also discussed ways to prevent folliculitis (avoid shaving, clean area daily, keep area dry etc). Patient desired faster resolution. Discussed that topical creams usually lighten the skin too much and do not significantly improve appearance. Patient acknowledged and still desired treatment. Referral to dermatology placed.

## 2014-08-13 NOTE — Progress Notes (Addendum)
Stacy Phillips is a 24 y.o. female who presents to the [facility] today for same day appointment with a chief complaint of vaginal discharge and skin discoloration. Her concerns today include:  HPI:  Skin discoloration Patient reports exposure to herpes when she was younger, but has not had an outbreak since a few years ago. Reports that her skin in her groin is now darker in spots. Also noticed a single "bump" on her inner left thigh. States that she has been trying to not shave. No burning or pain. No discharge.   Vaginal Discharge. Patient reports that she has had vaginal discharge for months. White in color and consistency varies from thick to thin. No recent antibiotics. No fevers or chills. No abdominal or back pain. Last intercourse a month ago. Does not use protection or birth control. No vaginal bleeding. Unsure of last LMP.    ROS: As per HPI  Past Medical History - Reviewed and updated Patient Active Problem List   Diagnosis Date Noted  . Bacterial vaginosis 08/13/2014  . Postinflammatory hyperpigmentation 08/13/2014  . Healthcare maintenance 08/13/2014  . Migraine 12/21/2013    Medications- reviewed and updated Current Outpatient Prescriptions  Medication Sig Dispense Refill  . cyclobenzaprine (FLEXERIL) 10 MG tablet Take 1 tablet (10 mg total) by mouth at bedtime as needed for muscle spasms. 30 tablet 0  . meloxicam (MOBIC) 15 MG tablet Take 1 tablet (15 mg total) by mouth daily. 30 tablet 0  . metroNIDAZOLE (FLAGYL) 500 MG tablet Take 1 tablet (500 mg total) by mouth 2 (two) times daily. For 7 days. 14 tablet 0  . propranolol ER (INDERAL LA) 80 MG 24 hr capsule Take 1 capsule (80 mg total) by mouth daily. 30 capsule 0  . triamcinolone cream (KENALOG) 0.1 % Apply 1 application topically 2 (two) times daily. 30 g 0  . valACYclovir (VALTREX) 1000 MG tablet Take 1 tablet (1,000 mg total) by mouth 2 (two) times daily. 20 tablet 0   No current facility-administered  medications for this visit.    Objective: Physical Exam: Temp(Src) 97.9 F (36.6 C) (Oral)  Wt 149 lb (67.586 kg)  LMP  (LMP Unknown)  Gen: NAD, resting comfortably CV: RRR with no murmurs appreciated Lungs: NWOB, CTAB with no crackles, wheezes, or rhonchi Abdomen: Normal bowel sounds present. Soft, Nontender, Nondistended. GU: Inflamed hair follicle noted on left thigh with no fluctuance or drainage. Several areas of post inflammatory hyperpigmentation noted in groin area. Speculum exam: Normal vaginal vault with thin white discharge noted. Cervix without purulent discharge.  Bimanual exam: No CMT or adnexal tenderness Ext: no edema Skin: warm, dry Neuro: grossly normal, moves all extremities  A/P: See problem list  Bacterial vaginosis Wet prep with clue cells. Will treat with 7 day course of flagyl.  UPT negative GC/GT pending.    Postinflammatory hyperpigmentation Areas of discoloration in groin consistent with postinflammatory hyperpigmentations secondary to folliculitis. Educated patient about typical course and duration of skin changes. Also discussed ways to prevent folliculitis (avoid shaving, clean area daily, keep area dry etc). Patient desired faster resolution. Discussed that topical creams usually lighten the skin too much and do not significantly improve appearance. Patient acknowledged and still desired treatment. Referral to dermatology placed.    Healthcare maintenance Pap sent today. Patient desired to be called or have VM left with results.      Orders Placed This Encounter  Procedures  . Ambulatory referral to Dermatology    Referral Priority:  Routine  Referral Type:  Consultation    Referral Reason:  Specialty Services Required    Requested Specialty:  Dermatology    Number of Visits Requested:  1  . POCT urine pregnancy  . POCT Wet Prep [facility] [place]    Meds ordered this encounter  Medications  . metroNIDAZOLE (FLAGYL) 500 MG tablet    Sig:  Take 1 tablet (500 mg total) by mouth 2 (two) times daily. For 7 days.    Dispense:  14 tablet    Refill:  0     Caleb M. Jimmey RalphParker, MD [facility] Resident PGY-1 08/13/2014 12:15 PM

## 2014-08-14 LAB — CERVICOVAGINAL ANCILLARY ONLY
Chlamydia: NEGATIVE
Neisseria Gonorrhea: NEGATIVE

## 2014-08-14 LAB — CYTOLOGY - PAP

## 2014-08-15 ENCOUNTER — Telehealth: Payer: Self-pay | Admitting: Family Medicine

## 2014-08-15 NOTE — Telephone Encounter (Signed)
Called patient and informed her of negative pap and GC/CT. Patient understood and had no further questions.  Katina Degree. Jimmey Ralph, MD [facility] Resident PGY-1 08/15/2014 11:28 AM

## 2014-11-26 ENCOUNTER — Ambulatory Visit (INDEPENDENT_AMBULATORY_CARE_PROVIDER_SITE_OTHER): Payer: 59 | Admitting: Physician Assistant

## 2014-11-26 VITALS — BP 102/80 | HR 58 | Temp 98.1°F | Resp 18 | Ht 65.5 in | Wt 153.4 lb

## 2014-11-26 DIAGNOSIS — Z111 Encounter for screening for respiratory tuberculosis: Secondary | ICD-10-CM

## 2014-11-26 NOTE — Patient Instructions (Signed)
You will return in 48-72 hours, as the CMA will instruct you.   Congrats again!

## 2014-11-26 NOTE — Progress Notes (Signed)
[facility] [address] [place] [address] [phone]  Date:  11/26/2014   Name:  Stacy Phillips   DOB:  1991/01/09   MRN:  [id number]  PCP:  Saralyn Pilar, DO    History of Present Illness:  Stacy Phillips is a 24 y.o. female patient who presents to [facility] for TB test.  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  She has no hx of tuberculosis, and successfully completed TB questionnaire below...   Tuberculosis Risk Questionnaire  1. No Were you born outside the [place] in one of the following parts of the world: [place], [place], [place], [place] or [place]?    2. No Have you traveled outside the [place] and lived for more than one month in one of the following parts of the world: [place], [place], [place], [place] or [place]?    3. No Do you have a compromised immune system such as from any of the following conditions:HIV/AIDS, organ or bone marrow transplantation, diabetes, immunosuppressive medicines (e.g. Prednisone, Remicaide), leukemia, lymphoma, cancer of the head or neck, gastrectomy or jejunal bypass, end-stage renal disease (on dialysis), or silicosis?     4.  Yes Have you ever or do you plan on working in: a residential care center, a health care facility, a jail or prison or homeless shelter?    5. No Have you ever: injected illegal drugs, used crack cocaine, lived in a homeless shelter  or been in jail or prison?     6. No Have you ever been exposed to anyone with infectious tuberculosis?    Tuberculosis Symptom Questionnaire  Do you currently have any of the following symptoms?  1. No Unexplained cough lasting more than 3 weeks?   2. No Unexplained fever lasting more than 3 weeks.   3. No Night Sweats (sweating that leaves the bedclothes and sheets wet)     4. No Shortness of Breath   5. NoChest Pain   6. No Unintentional weight loss    7. No Unexplained fatigue (very  tired for no reason)         Patient Active Problem List   Diagnosis Date Noted  . Bacterial vaginosis 08/13/2014  . Postinflammatory hyperpigmentation 08/13/2014  . Healthcare maintenance 08/13/2014  . Migraine 12/21/2013    Past Medical History  Diagnosis Date  . Genital herpes 2012    to call with name of medication    No past surgical history on file.  Social History  Substance Use Topics  . Smoking status: Current Every Day Smoker -- 0.10 packs/day    Types: Cigarettes  . Smokeless tobacco: None  . Alcohol Use: 0.6 oz/week    1 Standard drinks or equivalent per week    Family History  Problem Relation Age of Onset  . Cancer Maternal Grandmother   . Hypertension Maternal Grandfather     Allergies  Allergen Reactions  . Penicillins   . Sulfa Antibiotics     Medication list has been reviewed and updated.  Current Outpatient Prescriptions on File Prior to Visit  Medication Sig Dispense Refill  . cyclobenzaprine (FLEXERIL) 10 MG tablet Take 1 tablet (10 mg total) by mouth at bedtime as needed for muscle spasms. (Patient not taking: Reported on 11/26/2014) 30 tablet 0  . meloxicam (MOBIC) 15 MG tablet Take 1 tablet (15 mg total) by mouth daily. (Patient not taking: Reported on 11/26/2014) 30 tablet 0  . metroNIDAZOLE (FLAGYL) 500  MG tablet Take 1 tablet (500 mg total) by mouth 2 (two) times daily. For 7 days. (Patient not taking: Reported on 11/26/2014) 14 tablet 0  . propranolol ER (INDERAL LA) 80 MG 24 hr capsule Take 1 capsule (80 mg total) by mouth daily. (Patient not taking: Reported on 11/26/2014) 30 capsule 0  . triamcinolone cream (KENALOG) 0.1 % Apply 1 application topically 2 (two) times daily. (Patient not taking: Reported on 11/26/2014) 30 g 0  . valACYclovir (VALTREX) 1000 MG tablet Take 1 tablet (1,000 mg total) by mouth 2 (two) times daily. (Patient not taking: Reported on 11/26/2014) 20 tablet 0   No current facility-administered medications on file  prior to visit.    ROS ROS otherwise unremarkable unless listed above.   Physical Examination: BP 102/80 mmHg  Pulse 58  Temp(Src) 98.1 F (36.7 C) (Oral)  Resp 18  Ht 5' 5.5" (1.664 m)  Wt 153 lb 6 oz (69.57 kg)  BMI 25.13 kg/m2  SpO2 99%  LMP  (Approximate) Ideal Body Weight: Weight in (lb) to have BMI = 25: 152.2  Physical Exam  Constitutional: She is oriented to person, place, and time. She appears well-developed and well-nourished. No distress.  HENT:  Head: Normocephalic and atraumatic.  Right Ear: External ear normal.  Left Ear: External ear normal.  Eyes: Conjunctivae and EOM are normal. Pupils are equal, round, and reactive to light.  Cardiovascular: Normal rate.   Pulmonary/Chest: Effort normal. No respiratory distress.  Neurological: She is alert and oriented to person, place, and time.  Skin: She is not diaphoretic.  Psychiatric: She has a normal mood and affect. Her behavior is normal.     Assessment and Plan: 24 year old female is here today for TB test. Advised 48-72 hour return.  1. PPD screening test - TB Skin Test   Trena Platt, PA-C [facility] 11/26/2014 10:39 PM

## 2014-11-28 ENCOUNTER — Encounter: Payer: Self-pay | Admitting: Physician Assistant

## 2014-11-29 ENCOUNTER — Encounter: Payer: 59 | Admitting: Family Medicine

## 2014-11-29 DIAGNOSIS — Z111 Encounter for screening for respiratory tuberculosis: Secondary | ICD-10-CM

## 2014-11-29 LAB — TB SKIN TEST
Induration: 0 mm
TB Skin Test: NEGATIVE

## 2015-04-06 DIAGNOSIS — G43109 Migraine with aura, not intractable, without status migrainosus: Secondary | ICD-10-CM | POA: Diagnosis not present

## 2015-04-18 DIAGNOSIS — F4321 Adjustment disorder with depressed mood: Secondary | ICD-10-CM | POA: Diagnosis not present

## 2015-07-02 ENCOUNTER — Ambulatory Visit (INDEPENDENT_AMBULATORY_CARE_PROVIDER_SITE_OTHER): Payer: 59 | Admitting: Family Medicine

## 2015-07-02 ENCOUNTER — Other Ambulatory Visit (HOSPITAL_COMMUNITY)
Admission: RE | Admit: 2015-07-02 | Discharge: 2015-07-02 | Disposition: A | Discharge status: Home / Self Care | Payer: 59 | Source: Ambulatory Visit | Attending: Family Medicine | Admitting: Family Medicine

## 2015-07-02 ENCOUNTER — Encounter: Payer: Self-pay | Admitting: Family Medicine

## 2015-07-02 VITALS — BP 107/65 | HR 76 | Temp 98.4°F | Wt 147.3 lb

## 2015-07-02 DIAGNOSIS — L700 Acne vulgaris: Secondary | ICD-10-CM | POA: Diagnosis not present

## 2015-07-02 DIAGNOSIS — L810 Postinflammatory hyperpigmentation: Secondary | ICD-10-CM | POA: Diagnosis not present

## 2015-07-02 DIAGNOSIS — N898 Other specified noninflammatory disorders of vagina: Secondary | ICD-10-CM

## 2015-07-02 DIAGNOSIS — N760 Acute vaginitis: Secondary | ICD-10-CM | POA: Diagnosis not present

## 2015-07-02 DIAGNOSIS — Z113 Encounter for screening for infections with a predominantly sexual mode of transmission: Secondary | ICD-10-CM | POA: Diagnosis not present

## 2015-07-02 LAB — POCT WET PREP (WET MOUNT): CLUE CELLS WET PREP WHIFF POC: POSITIVE

## 2015-07-02 MED ORDER — BENZOYL PEROXIDE 10 % EX CREA: TOPICAL_CREAM | CUTANEOUS | Status: DC

## 2015-07-02 NOTE — Patient Instructions (Addendum)
Will refer to dermatologist Sent in cream for your acne (benzoyl peroxide) - apply daily Use good moisturizer on facial skin  For bump on vaginal area - use warm compress four times per day Avoid waxing/shaving  Follow up with Dr. Althea CharonKaramalegos in 6 weeks for acne  Be well, Dr. Pollie MeyerMcIntyre   Acne Acne is a skin problem that causes pimples. Acne occurs when the pores in the skin get blocked. The pores may become infected with bacteria, or they may become red, sore, and swollen. Acne is a common skin problem, especially for teenagers. Acne usually goes away over time. CAUSES Each pore contains an oil gland. Oil glands make an oily substance that is called sebum. Acne happens when these glands get plugged with sebum, dead skin cells, and dirt. Then, the bacteria that are normally found in the oil glands multiply and cause inflammation. Acne is commonly triggered by changes in your hormones. These hormonal changes can cause the oil glands to get bigger and to make more sebum. Factors that can make acne worse include:  Hormone changes during:  Adolescence.  Women's menstrual cycles.  Pregnancy.  Oil-based cosmetics and hair products.  Harshly scrubbing the skin.  Strong soaps.  Stress.  Hormone problems that are due to certain diseases.  Long or oily hair rubbing against the skin.  Certain medicines.  Pressure from headbands, backpacks, or shoulder pads.  Exposure to certain oils and chemicals. RISK FACTORS This condition is more likely to develop in:  Teenagers.  People who have a family history of acne. SYMPTOMS Acne often occurs on the face, neck, chest, and upper back. Symptoms include:  Small, red bumps (pimples or papules).  Whiteheads.  Blackheads.  Small, pus-filled pimples (pustules).  Big, red pimples or pustules that feel tender. More severe acne can cause:  An infected area that contains a collection of pus (abscess).  Hard, painful, fluid-filled  sacs (cysts).  Scars. DIAGNOSIS This condition is diagnosed with a medical history and physical exam. Blood tests may also be done. TREATMENT Treatment for this condition can vary depending on the severity of your acne. Treatment may include:  Creams and lotions that prevent oil glands from clogging.  Creams and lotions that treat or prevent infections and inflammation.  Antibiotic medicines that are applied to the skin or taken as a pill.  Pills that decrease sebum production.  Birth control pills.  Light or laser treatments.  Surgery.  Injections of medicine into the affected areas.  Chemicals that cause peeling of the skin. Your health care provider will also recommend the best way to take care of your skin. Good skin care is the most important part of treatment. HOME CARE INSTRUCTIONS Skin Care Take care of your skin as told by your health care provider. You may be told to do these things:  Wash your skin gently at least two times each day, as well as:  After you exercise.  Before you go to bed.  Use mild soap.  Apply a water-based skin moisturizer after you wash your skin.  Use a sunscreen or sunblock with SPF 30 or greater. This is especially important if you are using acne medicines.  Choose cosmetics that will not plug your oil glands (are noncomedogenic). Medicines  Take over-the-counter and prescription medicines only as told by your health care provider.  If you were prescribed an antibiotic medicine, apply or take it as told by your health care provider. Do not stop taking the antibiotic even if your  condition improves. General Instructions  Keep your hair clean and off of your face. If you have oily hair, shampoo your hair regularly or daily.  Avoid leaning your chin or forehead against your hands.  Avoid wearing tight headbands or hats.  Avoid picking or squeezing your pimples. That can make your acne worse and cause scarring.  Keep all  follow-up visits as told by your health care provider. This is important.  Shave gently and only when necessary.  Keep a food journal to figure out if any foods are linked with your acne. SEEK MEDICAL CARE IF:  Your acne is not better after eight weeks.  Your acne gets worse.  You have a large area of skin that is red or tender.  You think that you are having side effects from any acne medicine.   This information is not intended to replace advice given to you by your health care provider. Make sure you discuss any questions you have with your health care provider.   Document Released: 02/20/2000 Document Revised: 11/13/2014 Document Reviewed: 05/01/2014 Elsevier Interactive Patient Education [employer]

## 2015-07-03 LAB — CERVICOVAGINAL ANCILLARY ONLY
Chlamydia: NEGATIVE
Neisseria Gonorrhea: NEGATIVE
Trichomonas: NEGATIVE

## 2015-07-04 ENCOUNTER — Telehealth: Payer: Self-pay | Admitting: Family Medicine

## 2015-07-04 MED ORDER — FLUCONAZOLE 150 MG PO TABS: 150.0000 mg | ORAL_TABLET | Freq: Once | ORAL | Status: DC

## 2015-07-04 MED ORDER — METRONIDAZOLE 500 MG PO TABS: 500.0000 mg | ORAL_TABLET | Freq: Two times a day (BID) | ORAL | Status: DC

## 2015-07-04 MED FILL — FLUCONAZOLE 150 MG TABLET: 150 | 1 days supply | Qty: 1 | Fill #0

## 2015-07-04 MED FILL — metroNIDAZOLE 500 MG TABS: 500 | 7 days supply | Qty: 14 | Fill #0

## 2015-07-04 NOTE — Progress Notes (Signed)
Date of Visit: 07/02/2015   HPI:  Patient presents for a same day appointment to discuss several issues:  - acne on face - not using any medication currently on her face for acne  - bump in bikini line - been there for 3 weeks. Comes and goes in size. No drainage. No fevers. Shaves but is growing pubic hair out now because she's planning to get it waxed. She also greatly dislikes how dark the area of her bikini line is, has darkness around hair follicles. Wants to know what she can try for that. Agreeable for dermatology referral.  - vaginal discharge - wants to be tested for bacterial vaginosis. Has history of herpes, not flaring now. Has odor but no itching. no pelvic pain. Declines blood draw for HIV and RPR.  ROS: See HPI  PMFSH: history of migraine, BV  PHYSICAL EXAM: BP 107/65 mmHg  Pulse 76  Temp(Src) 98.4 F (36.9 C) (Oral)  Wt 147 lb 4.8 oz (66.815 kg)  LMP 06/01/2015 Gen: NAD, pleasant, cooperative HEENT: normocephalic, atraumatic. Moist mucous membranes  Abdomen: soft nontender to palpation GU: normal appearing external genitalia. Small nodule on skin of left labia majora, no fluctuance, tenderness, or discharge. No surrounding erythema. Vagina is moist with white/clear discharge. Cervix normal in appearance. No cervical motion tenderness or tenderness on bimanual exam. No adnexal masses. Hair follicles of pubic hair in bikini line have surrounding hyperpigmentation on skin. Skin: several closed comedones on face, some erythema  ASSESSMENT/PLAN:  1. Acne - recommend topical benzoyl peroxide. rx sent in  2. Nodule of vulva - likely folliculitis that has postinflammatory changes/scar tissue. No signs of active infection. Recommend warm compress four times a day to aid in drainage.  3. Hyperpigmentation of vulva - around hair follicles. Recommend abstaining from shaving and waxing for 2 months. Will refer to dermatology as requested.  4. Vaginal discharge - wet prep,  gc/chlamydia today to assess  FOLLOW UP: Follow up in 6 weeks with PCP for acne Referring to dermatology.  GrenadaBrittany J. Pollie MeyerMcIntyre, MD [facility]

## 2015-07-04 NOTE — Telephone Encounter (Signed)
Called patient to discuss wet prep results. Shows + bv and yeast. Gc/chlamydia negative  Will rx flagyl 500mg  twice daily for 7 days rx diflucan 150mg  x1 to be used at end of course of flagyl Discussed no alcohol while on flagyl  Patient appreciative  Stacy DodrillBrittany J Kirandeep Fariss, MD

## 2015-07-30 ENCOUNTER — Encounter: Payer: Self-pay | Admitting: Family Medicine

## 2015-07-30 ENCOUNTER — Other Ambulatory Visit (HOSPITAL_COMMUNITY)
Admission: RE | Admit: 2015-07-30 | Discharge: 2015-07-30 | Disposition: A | Discharge status: Home / Self Care | Payer: 59 | Source: Ambulatory Visit | Attending: Family Medicine | Admitting: Family Medicine

## 2015-07-30 ENCOUNTER — Ambulatory Visit (INDEPENDENT_AMBULATORY_CARE_PROVIDER_SITE_OTHER): Payer: 59 | Admitting: Family Medicine

## 2015-07-30 VITALS — BP 108/61 | HR 64 | Temp 98.1°F | Wt 152.0 lb

## 2015-07-30 DIAGNOSIS — N898 Other specified noninflammatory disorders of vagina: Secondary | ICD-10-CM | POA: Diagnosis not present

## 2015-07-30 DIAGNOSIS — Z113 Encounter for screening for infections with a predominantly sexual mode of transmission: Secondary | ICD-10-CM | POA: Insufficient documentation

## 2015-07-30 LAB — POCT WET PREP (WET MOUNT): CLUE CELLS WET PREP WHIFF POC: NEGATIVE

## 2015-07-30 MED ORDER — FLUCONAZOLE 150 MG PO TABS: 150.0000 mg | ORAL_TABLET | Freq: Once | ORAL | Status: DC

## 2015-07-30 MED FILL — FLUCONAZOLE 150 MG TABLET: 150 | 3 days supply | Qty: 2 | Fill #0

## 2015-07-30 NOTE — Patient Instructions (Signed)
Go pick up the diflucan. I sent in 2 pills: Take one pill today Take the second pill in 3 days if you are not better  I will call you with the results of the BV test  Be well, Dr. Pollie MeyerMcIntyre   Monilial Vaginitis Vaginitis in a soreness, swelling and redness (inflammation) of the vagina and vulva. Monilial vaginitis is not a sexually transmitted infection. CAUSES  Yeast vaginitis is caused by yeast (candida) that is normally found in your vagina. With a yeast infection, the candida has overgrown in number to a point that upsets the chemical balance. SYMPTOMS   White, thick vaginal discharge.  Swelling, itching, redness and irritation of the vagina and possibly the lips of the vagina (vulva).  Burning or painful urination.  Painful intercourse. DIAGNOSIS  Things that may contribute to monilial vaginitis are:  Postmenopausal and virginal states.  Pregnancy.  Infections.  Being tired, sick or stressed, especially if you had monilial vaginitis in the past.  Diabetes. Good control will help lower the chance.  Birth control pills.  Tight fitting garments.  Using bubble bath, feminine sprays, douches or deodorant tampons.  Taking certain medications that kill germs (antibiotics).  Sporadic recurrence can occur if you become ill. TREATMENT  Your caregiver will give you medication.  There are several kinds of anti monilial vaginal creams and suppositories specific for monilial vaginitis. For recurrent yeast infections, use a suppository or cream in the vagina 2 times a week, or as directed.  Anti-monilial or steroid cream for the itching or irritation of the vulva may also be used. Get your caregiver's permission.  Painting the vagina with methylene blue solution may help if the monilial cream does not work.  Eating yogurt may help prevent monilial vaginitis. HOME CARE INSTRUCTIONS   Finish all medication as prescribed.  Do not have sex until treatment is completed or  after your caregiver tells you it is okay.  Take warm sitz baths.  Do not douche.  Do not use tampons, especially scented ones.  Wear cotton underwear.  Avoid tight pants and panty hose.  Tell your sexual partner that you have a yeast infection. They should go to their caregiver if they have symptoms such as mild rash or itching.  Your sexual partner should be treated as well if your infection is difficult to eliminate.  Practice safer sex. Use condoms.  Some vaginal medications cause latex condoms to fail. Vaginal medications that harm condoms are:  Cleocin cream.  Butoconazole (Femstat).  Terconazole (Terazol) vaginal suppository.  Miconazole (Monistat) (may be purchased over the counter). SEEK MEDICAL CARE IF:   You have a temperature by mouth above 102 F (38.9 C).  The infection is getting worse after 2 days of treatment.  The infection is not getting better after 3 days of treatment.  You develop blisters in or around your vagina.  You develop vaginal bleeding, and it is not your menstrual period.  You have pain when you urinate.  You develop intestinal problems.  You have pain with sexual intercourse.   This information is not intended to replace advice given to you by your health care provider. Make sure you discuss any questions you have with your health care provider.   Document Released: 12/02/2004 Document Revised: 05/17/2011 Document Reviewed: 08/26/2014 Elsevier Interactive Patient Education [employer] Elsevier Inc.

## 2015-07-30 NOTE — Progress Notes (Signed)
Date of Visit: 07/30/2015   HPI:  Patient presents for a same day appointment to discuss vaginal pain.  I last saw her on 07/30/15 and diagnosed her with BV and yeast, prescribed flagyl 500mg  twice daily for 7 days and one dose of diflucan. She did not take the entire course of flagyl, stopped after a few days because she went out and had a drink and realized she couldn't take the medicine after drinking alcohol. Never took the diflucan.  Has noted recent vaginal dryness and discomfort. Is unsure of the reason of this. Does not usually use condoms but recently did use one and wasn't sure if she had a reaction to it. Vulva hurts and feels swollen. Noted lots of white material covering her vulva yesterday. Soaked in the bathtub yesterday and today to calm it down. Noticed just a little bit of blood right before this appointment, but isn't sure if that's just because things are irritated. Denies pelvic pain. Unsure when LMP is because it's irregular. Not doing anything to prevent pregnancy but does not desire to become pregnant. She does use lots of scented soaps in vaginal area.  Last pap reviewed - June 2016, normal  ROS: See HPI  PMFSH: history of bacterial vaginosis  PHYSICAL EXAM: BP 108/61 mmHg  Pulse 64  Temp(Src) 98.1 F (36.7 C) (Oral)  Wt 152 lb (68.947 kg)  LMP 07/30/2015 Gen: NAD, pleasant, cooperative HEENT: normocephalic, atraumatic GU: external genitalia with mild erythema and mildly swollen labia majora/clitoral area. Vagina is moist with thick white discharge. Cervix normal in appearance. Bimanual exam deferred due to discomfort. No blood seen in vagina.  ASSESSMENT/PLAN:  25 yo F presenting with vaginal pain, swelling, and white discharge. Symptoms and exam consistent with yeast candidiasis. Sent in diflucan 150mg  x1, repeat in 3 days if not better. Await wet prep/gc& chlamydia testing, will call patient with results. Discussed measures to prevent recurrent yeast including  avoiding sitting in wet bathing suits, using only mild soaps (or no soap)  Update - called patient shortly after her visit to inform her that wet prep was negative for BV but positive for yeast as expected. Reiterated importance of taking diflucan. Also asked if patient would be able to come by for pregnancy test to rule out early preg as we meant to obtain this prior to her leaving. She is unable to come today and is leaving town. As she has no pelvic pain and had no vaginal bleeding on my exam, ok to defer urine pregnancy test for now. Advised seeking care should she develop pelvic pain.  FOLLOW UP: Follow up as needed if symptoms worsen or fail to improve.    GrenadaBrittany J. Pollie MeyerMcIntyre, MD [facility]

## 2015-07-31 LAB — CERVICOVAGINAL ANCILLARY ONLY
CHLAMYDIA, DNA PROBE: NEGATIVE
NEISSERIA GONORRHEA: NEGATIVE

## 2015-08-15 ENCOUNTER — Telehealth: Payer: Self-pay | Admitting: Family Medicine

## 2015-08-15 NOTE — Telephone Encounter (Signed)
Called patient. She did not want to talk about her "personal area" - apparently has an appointment scheduled with me on Monday for this.  Instead she wanted to know about the acne medication I sent in for her before since her pharmacy doesn't have it on file. Advised that 10% benzoyl peroxide is available over the counter. She will plan to get it at the store.  Latrelle DodrillBrittany J McIntyre, MD

## 2015-08-15 NOTE — Telephone Encounter (Signed)
Pt is calling and would like to speak to Dr. Pollie MeyerMcIntyre about what's going on with her personal area. jw

## 2015-08-18 ENCOUNTER — Ambulatory Visit: Payer: 59 | Admitting: Family Medicine

## 2015-08-27 ENCOUNTER — Ambulatory Visit: Payer: 59 | Admitting: Family Medicine

## 2015-09-01 ENCOUNTER — Ambulatory Visit: Payer: 59 | Admitting: Family Medicine

## 2015-09-02 ENCOUNTER — Encounter: Payer: Self-pay | Admitting: Family Medicine

## 2015-09-02 ENCOUNTER — Ambulatory Visit (INDEPENDENT_AMBULATORY_CARE_PROVIDER_SITE_OTHER): Payer: 59 | Admitting: Family Medicine

## 2015-09-02 ENCOUNTER — Other Ambulatory Visit (HOSPITAL_COMMUNITY)
Admission: RE | Admit: 2015-09-02 | Discharge: 2015-09-02 | Disposition: A | Discharge status: Home / Self Care | Payer: 59 | Source: Ambulatory Visit | Attending: Family Medicine | Admitting: Family Medicine

## 2015-09-02 VITALS — BP 119/80 | HR 91 | Temp 98.4°F | Wt 148.0 lb

## 2015-09-02 DIAGNOSIS — Z113 Encounter for screening for infections with a predominantly sexual mode of transmission: Secondary | ICD-10-CM | POA: Diagnosis not present

## 2015-09-02 DIAGNOSIS — B9689 Other specified bacterial agents as the cause of diseases classified elsewhere: Secondary | ICD-10-CM

## 2015-09-02 DIAGNOSIS — N898 Other specified noninflammatory disorders of vagina: Secondary | ICD-10-CM | POA: Diagnosis not present

## 2015-09-02 DIAGNOSIS — Z7251 High risk heterosexual behavior: Secondary | ICD-10-CM

## 2015-09-02 DIAGNOSIS — L739 Follicular disorder, unspecified: Secondary | ICD-10-CM | POA: Insufficient documentation

## 2015-09-02 DIAGNOSIS — A499 Bacterial infection, unspecified: Secondary | ICD-10-CM

## 2015-09-02 DIAGNOSIS — N760 Acute vaginitis: Secondary | ICD-10-CM

## 2015-09-02 LAB — POCT WET PREP (WET MOUNT): Clue Cells Wet Prep Whiff POC: POSITIVE

## 2015-09-02 MED ORDER — METRONIDAZOLE 500 MG PO TABS: 500.0000 mg | ORAL_TABLET | Freq: Two times a day (BID) | ORAL | Status: DC

## 2015-09-02 MED ORDER — MUPIROCIN CALCIUM 2 % EX CREA: 1.0000 "application " | TOPICAL_CREAM | Freq: Two times a day (BID) | CUTANEOUS | Status: DC

## 2015-09-02 MED FILL — metroNIDAZOLE 500 MG TABS: 500 | 7 days supply | Qty: 14 | Fill #0

## 2015-09-02 MED FILL — MUPIROCIN 2% CREAM: 2 | 30 days supply | Qty: 15 | Fill #0

## 2015-09-02 NOTE — Assessment & Plan Note (Signed)
Mild pubic folliculitis seems most likely without acute flare or localized infection. Minimal active lesions today.  Plan: 1. Given rx topical mupirocin cream up to BID few weeks 2. Routine hygiene 3. Follow-up

## 2015-09-02 NOTE — Patient Instructions (Signed)
Thank you for coming in to clinic today.  1. Your symptoms sound consistent with a Vaginitis - irritation from likely BV or yeast.  2. Tested wet prep today looking for BV, Yeast, and Trichomonas - will call you with results. - If needed will send in Metronidazole 500mg  twice daily for 7 days (for either BV or Trich) no alcohol on this med. - If positive for Yeast - will send Diflucan 150mg  pill, take 1 and then on Day 3 take 2nd pill. - If both medicines sent, finish Metronidazole FIRST then take Diflucan.  3. Will call with results for Gonorrhea / Chlamydia swab in a few days  If tests negative, maybe normal symptoms following period. Unprotected intercourse can cause irritation and no infection sometimes. Make sure to try using condoms to see if reduces your symptoms. Probiotics and yogurt can help reduce BV as well.  For Bacterial Vaginosis (BV) - This problem can be recurrent. Some people are more likely to get it than others, and it has to do with normal body chemistry and vaginal environment. One of the biggest risk factors for causing BV is unprotected sexual intercourse (without a condom), because semen can change the chemistry of your vaginal environment, causing the "good bacteria" reduce and the other bacteria to grow and cause your symptoms. Recommend to start using condoms with intercourse to see if this helps reduce your symptoms, do this especially for next 1 week while on treatment. Some other recommendations include taking a daily probiotic and yogurt can help reduce BV as well, this is not proven to work in all patients.  Use Mupirocin (Bacitracin) antibiotic cream in groin on hair bump infected areas, use nightly for 1 week then increase to twice a day for next 2-4 weeks. You may try Hydrocortisone cream to lighten skin in this area if you want, this may take a while can use 1-2 times a day up to 1 month.  Please schedule a follow-up appointment within 1 month to follow-up Groin  Folliculitis / Vaginal Discharge  RECOMMEND STD TESTING with HIV / RPR at next visit.  If you have any other questions or concerns, please feel free to call the clinic to contact me. You may also schedule an earlier appointment if necessary.  However, if your symptoms get significantly worse, please go to the Emergency Department to seek immediate medical attention.  Saralyn PilarAlexander Hanne Kegg, DO [facility]

## 2015-09-02 NOTE — Progress Notes (Signed)
Subjective:    Patient ID: Stacy Phillips, female    DOB: 1991-02-12, 25 y.o.   MRN: [id number]  Stacy CourierQumeisha Phillips is a 25 y.o. female presenting on 09/02/2015 for Folliculitis and Vaginal Discharge  Patient presents for a same day appointment.  HPI  VAGINAL DISCHARGE / STD SCREENING: - [facility]26 and 5/24 both for vaginal discharge, previously diagnosed with BV given metronidazole stopped this med early due to alcohol consumption. Most recent office visit pelvic exam repeated with discharge dx with yeast vaginitis on wet prep, given Diflucan. - Today patient admits that she took 1 of the 2 yeast pills with significant resolution of symptoms entirely, until few days to 1 week ago when discharge returned. Admits to new sexual partner recently since that time around onset of symptoms. Does not use condoms. No form of contraception. Denies missing any period, but admits that her period is irregular. - No known STD exposure. Last checked GC/Chlamydia 07/2015 negative, agrees to re-test today, interested in blood testing for HIV/RPR  FOLLICULITIS, Groin: - Reports has had problems with "hair bumps" in groin and darkening of skin, she has seen dermatology for this in the past, but not given any treatment. Admits to occasional bumps with redness swelling, denies any significant drainage of pus or abscess formation. Denies fevers/chills or rash.   Social History  Substance Use Topics  . Smoking status: Current Every Day Smoker -- 0.10 packs/day    Types: Cigarettes  . Smokeless tobacco: None  . Alcohol Use: 0.6 oz/week    1 Standard drinks or equivalent per week    Review of Systems Per HPI unless specifically indicated above     Objective:    BP 119/80 mmHg  Pulse 91  Temp(Src) 98.4 F (36.9 C) (Oral)  Wt 148 lb (67.132 kg)  Wt Readings from Last 3 Encounters:  09/02/15 148 lb (67.132 kg)  07/30/15 152 lb (68.947 kg)  07/02/15 147 lb 4.8 oz (66.815 kg)      Physical Exam  Constitutional: She appears well-developed and well-nourished. No distress.  Well-appearing, comfortable, cooperative  HENT:  Mouth/Throat: Oropharynx is clear and moist.  Genitourinary:  Normal external female genitalia. Vaginal canal without lesions. Normal appearing cervix, without lesions or bleeding. Increased thicker discharge on exam.  Pelvic skin in groin with scattered nodular folliculitis appearing lesions, without active erythema ulceration or drainage. Non-tender. Hyperpigmented skin in this region.  Pelvic Exam chaperoned by Stacy PromiseMeredith Phillips CMA  Neurological: She is alert.  Skin: Skin is warm and dry. She is not diaphoretic.  Nursing note and vitals reviewed.  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 Minimal active lesions today.  Plan: 1. Given rx topical mupirocin cream up to BID few weeks 2. Routine hygiene 3. Follow-up  Relevant Medications   mupirocin cream (BACTROBAN) 2 %    Other Visit Diagnoses    Vaginal discharge    -  Primary    Relevant Medications    metroNIDAZOLE (FLAGYL) 500 MG tablet    Other Relevant Orders    POCT Wet Prep [facility] [place]) (Completed)    Cervicovaginal ancillary only    High risk sexual behavior        Screen for STD (sexually transmitted disease)        Pending GC/Chlamydia, offered HIV/RPR initially ordered but lab closed before draw, advised repeat at next  visit.    BV (bacterial vaginosis)        Confirmed on wet prep. Likely secondary to unprotected intercourse. Rx Flagyl, condom use.    Relevant Medications    mupirocin cream (BACTROBAN) 2 %    metroNIDAZOLE (FLAGYL) 500 MG tablet       Meds ordered this encounter  Medications  . mupirocin cream (BACTROBAN) 2 %    Sig: Apply 1 application topically 2 (two) times daily. Start nightly for 1 week, then increase to 2 times daily up to 1 month.    Dispense:  15 g    Refill:  1  . metroNIDAZOLE (FLAGYL) 500 MG tablet    Sig: Take 1 tablet (500 mg total) by mouth 2 (two) times daily. For 7 days.    Dispense:  14 tablet    Refill:  0      Follow up plan: Return in about 4 weeks (around 09/30/2015) for folliculitis, vaginal discharge.  Stacy PilarAlexander Karamalegos, DO [facility], PGY-3

## 2015-09-04 LAB — CERVICOVAGINAL ANCILLARY ONLY
Chlamydia: NEGATIVE
Neisseria Gonorrhea: NEGATIVE

## 2015-09-16 ENCOUNTER — Other Ambulatory Visit: Payer: Self-pay | Admitting: *Deleted

## 2015-09-16 NOTE — Telephone Encounter (Signed)
Patient called and states that she is currently having a breakout.  She would like a refill on her valtrex.  I informed patient that she would need to be seen for this medication refill since she hasn't taken it in over 2 years.  Patient voiced understanding and appt made for 09-26-15.  Patient would like to know if she can get a refill to last until her appt since she is currently having a breakout. Carlie Solorzano,CMA

## 2015-09-17 MED ORDER — VALACYCLOVIR HCL 1 G PO TABS: 1000.0000 mg | ORAL_TABLET | Freq: Two times a day (BID) | ORAL | Status: DC

## 2015-09-17 NOTE — Telephone Encounter (Signed)
Will refill but patient must be seen before will write any additional refills.  Tarri AbernethyAbigail J Maddisen Vought, MD, MPH PGY-2 [facility] Pager [phone]

## 2015-09-26 ENCOUNTER — Ambulatory Visit (INDEPENDENT_AMBULATORY_CARE_PROVIDER_SITE_OTHER): Payer: 59 | Admitting: Internal Medicine

## 2015-09-26 ENCOUNTER — Encounter: Payer: Self-pay | Admitting: Internal Medicine

## 2015-09-26 VITALS — BP 123/70 | HR 105 | Temp 98.4°F | Ht 65.5 in | Wt 145.2 lb

## 2015-09-26 DIAGNOSIS — L700 Acne vulgaris: Secondary | ICD-10-CM | POA: Diagnosis not present

## 2015-09-26 DIAGNOSIS — L739 Follicular disorder, unspecified: Secondary | ICD-10-CM | POA: Diagnosis not present

## 2015-09-26 DIAGNOSIS — L709 Acne, unspecified: Secondary | ICD-10-CM | POA: Insufficient documentation

## 2015-09-26 MED ORDER — BENZOYL PEROXIDE 10 % EX CREA: TOPICAL_CREAM | CUTANEOUS | Status: DC

## 2015-09-26 MED ORDER — MUPIROCIN CALCIUM 2 % EX CREA: 1.0000 "application " | TOPICAL_CREAM | Freq: Two times a day (BID) | CUTANEOUS | Status: DC

## 2015-09-26 NOTE — Patient Instructions (Signed)
It was nice meeting you today Stacy Phillips!  For your face, you can apply the benzoyl peroxide cream once a day.   You can apply the bactroban ointment to your bikini area or where you have bumps two times a day.   I will see you back in one month to make sure your symptoms are improving.   If you have any questions or concerns, please feel free to call the clinic.   Be well,  Dr. Natale MilchLancaster

## 2015-09-26 NOTE — Assessment & Plan Note (Signed)
Improving but still present. Referred to derm in past but no longer follows with them. Would like to try benzoyl peroxide.  - Re-prescribe benzoyl peroxide qd - F/u in one month - Consider referral to dermatology again if patient desires

## 2015-09-26 NOTE — Assessment & Plan Note (Signed)
Findings most consistent with mild pubic folliculitis. No signs of infections. 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  - Re-prescribed topical mupirocin cream to be applied BID - F/u in one month

## 2015-09-26 NOTE — Progress Notes (Signed)
   Subjective:    Patient ID: Stacy Phillips, female    DOB: [birth year], 25 y.o.   MRN: [id number]  HPI  Patient presents for follow-up of acne and bumps in genital area.   Acne/Hyperpigmentation Patient has history of acne for which she has been seen by a dermatologist before. She was prescribed benzoyl preoxide three months ago by Dr. Pollie MeyerMcIntyre, however she did not pick up the prescription. She is also concerned about the dark circles under her eyes and some hyperpigmented areas on her cheeks. She has tried some creams and special soaps at home with no improvement in symptoms.   Bumps in genital area Patient has prior diagnosis of folliculitis. She was evaluated by Dr. Althea CharonKaramalegos for these same bumps one month ago, for which she was prescribed bactroban, however patient did not pick up the prescription. The bumps have not worsened since that appointment, but have not gone away. She denies pain or itching. Does endorse unprotected sex, but denies vaginal discharge or irritation, or dysuria. Patient was diagnosed with HSV 2-3 years ago and is concerned she may have an HSV flare, although she does not have the same pain that she did when she was initially diagnosed.    Review of Systems See HPI.     Objective:   Physical Exam  Constitutional: She is oriented to person, place, and time. She appears well-developed and well-nourished. No distress.  Genitourinary: Vagina normal. No vaginal discharge found.  External genitalia WNL with exception of two ingrown hairs noted without fluctuance, induration, or surrounding erythema. No vesicles, ulcerations, or other lesions noted.   Neurological: She is alert and oriented to person, place, and time.  Psychiatric: She has a normal mood and affect. Her behavior is normal.      Assessment & Plan:  Folliculitis Findings most consistent with mild pubic folliculitis. No signs of infections. 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  - Re-prescribed topical mupirocin cream to be applied BID - F/u in one month  Acne Improving but still present. Referred to derm in past but no longer follows with them. Would like to try benzoyl peroxide.  - Re-prescribe benzoyl peroxide qd - F/u in one month - Consider referral to dermatology again if patient desires   Tarri AbernethyAbigail J Lancaster, MD, MPH PGY-2 [facility] Pager [phone]

## 2015-10-08 MED FILL — MUPIROCIN 2% CREAM: 2 | 10 days supply | Qty: 15 | Fill #0

## 2015-11-11 ENCOUNTER — Other Ambulatory Visit: Payer: Self-pay | Admitting: Internal Medicine

## 2015-11-11 DIAGNOSIS — N898 Other specified noninflammatory disorders of vagina: Secondary | ICD-10-CM

## 2015-11-11 DIAGNOSIS — N760 Acute vaginitis: Secondary | ICD-10-CM

## 2015-11-11 DIAGNOSIS — B9689 Other specified bacterial agents as the cause of diseases classified elsewhere: Secondary | ICD-10-CM

## 2015-11-11 NOTE — Telephone Encounter (Signed)
Pt called and needs a refill on Flagyl. jw

## 2015-11-14 ENCOUNTER — Ambulatory Visit: Payer: 59 | Admitting: Internal Medicine

## 2015-11-18 ENCOUNTER — Encounter: Payer: Self-pay | Admitting: Family Medicine

## 2015-11-18 ENCOUNTER — Ambulatory Visit (INDEPENDENT_AMBULATORY_CARE_PROVIDER_SITE_OTHER): Payer: 59 | Admitting: Family Medicine

## 2015-11-18 VITALS — BP 122/68 | HR 95 | Temp 98.5°F | Ht 65.5 in | Wt 144.2 lb

## 2015-11-18 DIAGNOSIS — N898 Other specified noninflammatory disorders of vagina: Secondary | ICD-10-CM | POA: Diagnosis not present

## 2015-11-18 DIAGNOSIS — A499 Bacterial infection, unspecified: Secondary | ICD-10-CM | POA: Diagnosis not present

## 2015-11-18 DIAGNOSIS — J029 Acute pharyngitis, unspecified: Secondary | ICD-10-CM

## 2015-11-18 DIAGNOSIS — N760 Acute vaginitis: Secondary | ICD-10-CM | POA: Diagnosis not present

## 2015-11-18 DIAGNOSIS — B9689 Other specified bacterial agents as the cause of diseases classified elsewhere: Secondary | ICD-10-CM

## 2015-11-18 LAB — POCT RAPID STREP A (OFFICE): RAPID STREP A SCREEN: NEGATIVE

## 2015-11-18 LAB — POCT MONO (EPSTEIN BARR VIRUS): MONO, POC: NEGATIVE

## 2015-11-18 MED ORDER — AZITHROMYCIN 250 MG PO TABS: ORAL_TABLET | ORAL | 0 refills | Status: DC

## 2015-11-18 MED ORDER — METRONIDAZOLE 500 MG PO TABS: 500.0000 mg | ORAL_TABLET | Freq: Two times a day (BID) | ORAL | 0 refills | Status: DC

## 2015-11-18 MED FILL — metroNIDAZOLE 500 MG TABS: 500 | 7 days supply | Qty: 14 | Fill #0

## 2015-11-18 MED FILL — AZITHROMYCIN 250 MG TABLET: 250 | 5 days supply | Qty: 6 | Fill #0

## 2015-11-18 NOTE — Progress Notes (Signed)
Date of Visit: 11/18/2015   HPI:  Patient presents for a same day appointment to discuss sore throat.  Present for 2 days. Has pain with swallowing on R side. Also has pain in R aspect of neck and R ear. Last night had subjective fever but did not take temperature. Drinking some gatorade to stay hydrated. Stooling and urinating normally. Best friend also had a sore throat last week. Feels very fatigued as well.    Also requests refill of flagyl. Has usual BV again due to not completing entire course of therapy last time (lost the rx). Is sure it is BV, consistent with prior episodes.  ROS: See HPI  PMFSH: history of folliculitis, acne, bacterial vaginosis, migraine  PHYSICAL EXAM: BP 122/68   Pulse 95   Temp 98.5 F (36.9 C) (Oral)   Ht 5' 5.5" (1.664 m)   Wt 144 lb 3.2 oz (65.4 kg)   BMI 23.63 kg/m  Gen: NAD, pleasant, cooperative HEENT: normocephalic, atraumatic. Moist mucous membranes. Oropharynx erythematous with enlarged tonsills with diffuse white exudate present on tonsils. No tonsillar asymmetry. Uvula raises midline. No trismus or drooling. Nares patent. Tympanic membranes normal bilaterally. Mastoids nontender bilaterally.  No tenderness of parotid glands bilaterally. + tender R anterior lymphadenopathy. No posterior lymphadenopathy appreciated.  Heart: regular rate and rhythm. No murmur Lungs: clear to auscultation bilaterally, normal work of breathing  Abdomen: soft, nontender to palpation, no palpable splenomegaly Neuro: alert grossly nonfocal, speech normal  ASSESSMENT/PLAN:  1. Sore throat - differential diagnosis includes group A streptococcal infection versus viral pharyngitis versus EBV infection. No signs of peritonsillar abscess or other deeper infection on exam today. POCT strep testing & monospot are negative, though neither of these definitively rule out strep or mono (especially early in course of illness). Favor strep throat presently, given lack of posterior  chain lymphadenopathy and splenomegaly which would be more typical with mono. Discussed treatment options with patient. After discussion we elected to follow the following plan: - treat for strep throat now with azithromycin (PCN allergy of unknown type - patient does not want to risk using cephalosporins at present) - send throat culture for strep - follow up later this week if not improving. Would repeat monospot at that time, also consider CBC with diff to evaluate for lymphocytosis - discussed return precautions extensively, including reasons to be seen in ER  2. Presumed BV - repeat course of PO flagyl, rx sent in  FOLLOW UP: Follow up as needed if symptoms worsen or fail to improve.    GrenadaBrittany J. Pollie MeyerMcIntyre, MD [facility]

## 2015-11-18 NOTE — Patient Instructions (Signed)
Treating you for strep throat - sent in azithromycin Sending culture Follow up on Friday if not feeling any better  Go to ER if unable to swallow, drooling, increased swelling, fevers, inability to open your mouth, etc.  Also sent in refill of flagyl for your BV  Be well, Dr. Pollie Meyer     Strep Throat Strep throat is a bacterial infection of the throat. Your health care provider may call the infection tonsillitis or pharyngitis, depending on whether there is swelling in the tonsils or at the back of the throat. Strep throat is most common during the cold months of the year in children who are 78-71 years of age, but it can happen during any season in people of any age. This infection is spread from person to person (contagious) through coughing, sneezing, or close contact. CAUSES Strep throat is caused by the bacteria called Streptococcus pyogenes. RISK FACTORS This condition is more likely to develop in:  People who spend time in crowded places where the infection can spread easily.  People who have close contact with someone who has strep throat. SYMPTOMS Symptoms of this condition include:  Fever or chills.   Redness, swelling, or pain in the tonsils or throat.  Pain or difficulty when swallowing.  White or yellow spots on the tonsils or throat.  Swollen, tender glands in the neck or under the jaw.  Red rash all over the body (rare). DIAGNOSIS This condition is diagnosed by performing a rapid strep test or by taking a swab of your throat (throat culture test). Results from a rapid strep test are usually ready in a few minutes, but throat culture test results are available after one or two days. TREATMENT This condition is treated with antibiotic medicine. HOME CARE INSTRUCTIONS Medicines  Take over-the-counter and prescription medicines only as told by your health care provider.  Take your antibiotic as told by your health care provider. Do not stop taking the  antibiotic even if you start to feel better.  Have family members who also have a sore throat or fever tested for strep throat. They may need antibiotics if they have the strep infection. Eating and Drinking  Do not share food, drinking cups, or personal items that could cause the infection to spread to other people.  If swallowing is difficult, try eating soft foods until your sore throat feels better.  Drink enough fluid to keep your urine clear or pale yellow. General Instructions  Gargle with a salt-water mixture 3-4 times per day or as needed. To make a salt-water mixture, completely dissolve -1 tsp of salt in 1 cup of warm water.  Make sure that all household members wash their hands well.  Get plenty of rest.  Stay home from school or work until you have been taking antibiotics for 24 hours.  Keep all follow-up visits as told by your health care provider. This is important. SEEK MEDICAL CARE IF:  The glands in your neck continue to get bigger.  You develop a rash, cough, or earache.  You cough up a thick liquid that is green, yellow-brown, or bloody.  You have pain or discomfort that does not get better with medicine.  Your problems seem to be getting worse rather than better.  You have a fever. SEEK IMMEDIATE MEDICAL CARE IF:  You have new symptoms, such as vomiting, severe headache, stiff or painful neck, chest pain, or shortness of breath.  You have severe throat pain, drooling, or changes in your voice.  You have swelling of the neck, or the skin on the neck becomes red and tender.  You have signs of dehydration, such as fatigue, dry mouth, and decreased urination.  You become increasingly sleepy, or you cannot wake up completely.  Your joints become red or painful.   This information is not intended to replace advice given to you by your health care provider. Make sure you discuss any questions you have with your health care provider.   Document  Released: 02/20/2000 Document Revised: 11/13/2014 Document Reviewed: 06/17/2014 Elsevier Interactive Patient Education [employer] Inc[employer]

## 2015-11-20 LAB — CULTURE, GROUP A STREP: Organism ID, Bacteria: NORMAL

## 2015-11-21 ENCOUNTER — Telehealth: Payer: Self-pay | Admitting: Family Medicine

## 2015-11-21 NOTE — Telephone Encounter (Signed)
Called patient to discuss throat culture. Was negative for strep, surprisingly. Patient reports took two pills of azithro and then felt sick - had diarrhea, vomiting so she stopped it. Now is feeling much better. Throat no longer has exudates and talking better.  Advised that with strep test negative, theoretically raises concern for other bacterial infection of the pharynx - ie possible gonorrhea/chlamydia and offered that we could swab her throat for these pathogens. However, since she is feeling so much better already, suspect this infection was viral and can just be observed. Offered she can come in to have testing for gc/chl of pharynx if she would like, but ok to observe for now.  Patient appreciative  Latrelle DodrillBrittany J Portland Sarinana, MD

## 2015-11-26 ENCOUNTER — Other Ambulatory Visit (HOSPITAL_COMMUNITY)
Admission: RE | Admit: 2015-11-26 | Discharge: 2015-11-26 | Disposition: A | Discharge status: Home / Self Care | Payer: 59 | Source: Ambulatory Visit | Attending: Family Medicine | Admitting: Family Medicine

## 2015-11-26 ENCOUNTER — Ambulatory Visit (INDEPENDENT_AMBULATORY_CARE_PROVIDER_SITE_OTHER): Payer: 59 | Admitting: Family Medicine

## 2015-11-26 VITALS — BP 118/72 | HR 80 | Temp 98.3°F | Wt 148.0 lb

## 2015-11-26 DIAGNOSIS — Z113 Encounter for screening for infections with a predominantly sexual mode of transmission: Secondary | ICD-10-CM | POA: Diagnosis not present

## 2015-11-26 DIAGNOSIS — N898 Other specified noninflammatory disorders of vagina: Secondary | ICD-10-CM

## 2015-11-26 DIAGNOSIS — B009 Herpesviral infection, unspecified: Secondary | ICD-10-CM | POA: Insufficient documentation

## 2015-11-26 LAB — POCT WET PREP (WET MOUNT)
CLUE CELLS WET PREP WHIFF POC: NEGATIVE
TRICHOMONAS WET PREP HPF POC: ABSENT

## 2015-11-26 MED ORDER — VALACYCLOVIR HCL 1 G PO TABS: 500.0000 mg | ORAL_TABLET | Freq: Two times a day (BID) | ORAL | 0 refills | Status: AC

## 2015-11-26 MED FILL — valACYclovir HCL 1 GM TABS: 1 | 3 days supply | Qty: 3 | Fill #0

## 2015-11-26 NOTE — Progress Notes (Signed)
   Subjective:    Patient ID: Stacy Phillips , female   DOB: Jun 17, 1990 , 25 y.o..   MRN: [id number]  HPI  Stacy Phillips is here for Concerns of a herpes outbreak. Patient states that she has had genital herpes before, her last outbreak was a couple years ago. She notes that she had unprotected sex 2 days ago with a partner that she has been with for a couple months. She admits to a burning sensation in her vagina for the last 2 days as well as some vaginal discharge. The vaginal discharge is mostly clear. She denies any vaginal bleeding or pain with intercourse. Denies any dysuria, pelvic pain, flank pain. She has previously taken Valtrex for herpes but that was a couple years ago.    Review of Systems: Per HPI. All other systems reviewed and are negative.  Past Medical History: Patient Active Problem List   Diagnosis Date Noted  . Vaginal lesion 11/26/2015  . Acne 09/26/2015  . Folliculitis 09/02/2015  . Bacterial vaginosis 08/13/2014  . Postinflammatory hyperpigmentation 08/13/2014  . Healthcare maintenance 08/13/2014  . Migraine 12/21/2013    Social Hx:  reports that she has been smoking Cigarettes.  She has been smoking about 0.10 packs per day. She does not have any smokeless tobacco history on file.    Objective:   BP 118/72   Pulse 80   Temp 98.3 F (36.8 C)   Wt 148 lb (67.1 kg)   SpO2 99%   BMI 24.25 kg/m  Physical Exam  Gen: NAD, alert, cooperative with exam, well-appearing GYN:  Mucousal side of right inferior labia showing 2 erythematous round lesions ~242mm in diameter each.  Vaginal mucosa pink, moist, normal rugae but with copious white discharge.  Nonfriable cervix without lesions, white discharge noted on speculum exam. No bleeding or other lesions noted on vagina or cervix.  Bimanual exam revealed normal, nongravid uterus.  No cervical motion tenderness. No adnexal masses bilaterally.      Assessment & Plan:  Vaginal lesion Concerns for genital  herpes as patient has had this in the past "years ago". Additionally concerned for other STDS as she had unprotected sex 2 days ago and has copious vaginal discharge on exam.  Orders Placed This Encounter  Procedures  . Herpes Simplex Virus Culture  . POCT Wet Prep ([place] [place])  GC/Chlamydia  -We will treat for likely genital herpes with Valtrex 500 mg twice a day 3 days -Discussed safe sex practices with patient and to use condoms -We will call patient with results of above tests -Return precautions discussed -Consider testing for HIV in the future    Anders Simmondshristina Keoni Havey, MD [facility], PGY-2

## 2015-11-26 NOTE — Assessment & Plan Note (Addendum)
Concerns for genital herpes as patient has had this in the past "years ago". Additionally concerned for other STDS as she had unprotected sex 2 days ago and has copious vaginal discharge on exam.  Orders Placed This Encounter  Procedures  . Herpes Simplex Virus Culture  . POCT Wet Prep ([place] [place])  GC/Chlamydia  -We will treat for likely genital herpes with Valtrex 500 mg twice a day 3 days -Discussed safe sex practices with patient and to use condoms -We will call patient with results of above tests -Return precautions discussed -Consider testing for HIV in the future

## 2015-11-26 NOTE — Patient Instructions (Signed)
Thank you for coming in today, it was so nice to see you! Today we talked about:    Vaginal pain: I think you may have genital herpes, we will treat you for that with Valtrex. Please take 500 mg twice daily for 3 days.   Continue to use condoms for protection against STDS  Please come back if after being treated your vaginal pain is worse.    If we ordered any tests today, you will be notified via telephone of any abnormalities.   If you have any questions or concerns, please do not hesitate to call the office at [phone]. You can also message me directly via MyChart.   Sincerely,  Anders Simmondshristina Arnoldo Hildreth, MD   Genital Herpes Genital herpes is a common sexually transmitted infection (STI) that is caused by a virus. The virus is spread from person to person through sexual contact. Infection can cause itching, blisters, and sores in the genital area or rectal area. This is called an outbreak. It affects both men and women. Genital herpes is particularly concerning for pregnant women because the virus can be passed to the baby during delivery and cause serious problems. Genital herpes is also a concern for people with a weakened defense (immune) system. Symptoms of genital herpes may last several days and then go away. However, the virus remains in your body, so you may have more outbreaks of symptoms in the future. The time between outbreaks varies and can be months or years. CAUSES Genital herpes is caused by a virus called herpes simplex virus (HSV) type 2 or HSV type 1. These viruses are contagious and are most often spread through sexual contact with an infected person. Sexual contact includes vaginal, anal, and oral sex. RISK FACTORS Risk factors for genital herpes include:  Being sexually active with multiple partners.  Having unprotected sex. SIGNS AND SYMPTOMS Symptoms may include:  Pain and itching in the genital area or rectal area.  Small red bumps that turn into blisters  and then turn into sores.  Flu-like symptoms, including:  Fever.  Body aches.  Painful urination.  Vaginal discharge. DIAGNOSIS Genital herpes may be diagnosed by:  Physical exam.  Blood test.  Fluid culture test from an open sore. TREATMENT There is no cure for genital herpes. Oral antiviral medicines may be used to speed up healing and to help prevent the return of symptoms. These medicines can also help to reduce the spread of the virus to sexual partners. HOME CARE INSTRUCTIONS  Keep the affected areas dry and clean.  Take medicines only as directed by your health care provider.  Do not have sexual contact during active infections. Genital herpes is contagious.  Practice safe sex. Latex condoms and female condoms may help to prevent the spread of the herpes virus.  Avoid rubbing or touching the blisters and sores. If you do touch the blister or sores:  Wash your hands thoroughly.  Do not touch your eyes afterward.  If you become pregnant, tell your health care provider if you have had genital herpes.  Keep all follow-up visits as directed by your health care provider. This is important. PREVENTION  Use condoms. Although anyone can contract genital herpes during sexual contact even with the use of a condom, a condom can provide some protection.  Avoid having multiple sexual partners.  Talk to your sexual partner about any symptoms and past history that either of you may have.  Get tested before you have sex. Ask your partner to  do the same.  Recognize the symptoms of genital herpes. Do not have sexual contact if you notice these symptoms. SEEK MEDICAL CARE IF:  Your symptoms are not improving with medicine.  Your symptoms return.  You have new symptoms.  You have a fever.  You have abdominal pain.  You have redness, swelling, or pain in your eye. MAKE SURE YOU:  Understand these instructions.  Will watch your condition.  Will get help right  away if you are not doing well or get worse.   This information is not intended to replace advice given to you by your health care provider. Make sure you discuss any questions you have with your health care provider.   Document Released: 02/20/2000 Document Revised: 03/15/2014 Document Reviewed: 07/10/2013 Elsevier Interactive Patient Education [employer]

## 2015-11-27 ENCOUNTER — Telehealth: Payer: Self-pay | Admitting: Internal Medicine

## 2015-11-27 ENCOUNTER — Telehealth: Payer: Self-pay | Admitting: Family Medicine

## 2015-11-27 LAB — GC/CHLAMYDIA PROBE AMP (~~LOC~~) NOT AT ARMC
CHLAMYDIA, DNA PROBE: NEGATIVE
NEISSERIA GONORRHEA: NEGATIVE

## 2015-11-27 NOTE — Telephone Encounter (Signed)
Attempted to call patient with wet prep results, no answer and no option to leave voicemail.

## 2015-11-27 NOTE — Telephone Encounter (Signed)
Pt is calling about her test  Results jw

## 2015-11-27 NOTE — Telephone Encounter (Signed)
Tried calling patient back 3 times. No answer and no option for voicemail. If patient calls back please inform her that she does NOT have gonorrhea, chlamydia, trichomonas, or bacterial vaginosis. She did have some yeast on wet prep so if she is having discharge and would like to be treated for yeast infection let me know and I will send over a prescription to her pharmacy. The herpes culture is still cooking and we don't know the result yet.   Anders Simmondshristina Keygan Dumond, MD [facility], PGY-2

## 2015-11-27 NOTE — Telephone Encounter (Signed)
Patient returned provider's call regarding her wet prep results.  Please call back with these or clarify for CMA what they showed and if there is a treatment plan. Huan Pollok,CMA

## 2015-11-28 LAB — HERPES SIMPLEX VIRUS CULTURE: Organism ID, Bacteria: NOT DETECTED

## 2015-11-28 NOTE — Telephone Encounter (Signed)
Pt informed of dr's message. She is unable to tell if she is having discharge due to her being on her period. Ayad Nieman Bruna PotterBlount, CMA

## 2015-12-01 ENCOUNTER — Encounter: Payer: Self-pay | Admitting: Family Medicine

## 2015-12-29 ENCOUNTER — Ambulatory Visit: Payer: 59 | Admitting: Internal Medicine

## 2015-12-30 ENCOUNTER — Ambulatory Visit: Payer: 59 | Admitting: Family Medicine

## 2016-01-01 ENCOUNTER — Ambulatory Visit: Payer: 59 | Admitting: Family Medicine

## 2016-01-08 ENCOUNTER — Ambulatory Visit: Payer: 59 | Admitting: Student

## 2016-01-12 ENCOUNTER — Ambulatory Visit (INDEPENDENT_AMBULATORY_CARE_PROVIDER_SITE_OTHER): Payer: 59 | Admitting: Family Medicine

## 2016-01-12 ENCOUNTER — Encounter: Payer: Self-pay | Admitting: Family Medicine

## 2016-01-12 VITALS — BP 118/62 | HR 86 | Wt 150.0 lb

## 2016-01-12 DIAGNOSIS — N898 Other specified noninflammatory disorders of vagina: Secondary | ICD-10-CM

## 2016-01-12 DIAGNOSIS — L739 Follicular disorder, unspecified: Secondary | ICD-10-CM

## 2016-01-12 LAB — POCT WET PREP (WET MOUNT)
CLUE CELLS WET PREP WHIFF POC: POSITIVE
TRICHOMONAS WET PREP HPF POC: ABSENT

## 2016-01-12 MED ORDER — FLUCONAZOLE 150 MG PO TABS: 150.0000 mg | ORAL_TABLET | ORAL | 0 refills | Status: DC

## 2016-01-12 MED ORDER — CLINDAMYCIN PHOSPHATE 1 % EX GEL: Freq: Two times a day (BID) | CUTANEOUS | 0 refills | Status: DC

## 2016-01-12 MED ORDER — METRONIDAZOLE 500 MG PO TABS: 500.0000 mg | ORAL_TABLET | Freq: Two times a day (BID) | ORAL | 0 refills | Status: AC

## 2016-01-12 MED ORDER — HYDROCORTISONE 0.5 % EX CREA: 1.0000 "application " | TOPICAL_CREAM | Freq: Two times a day (BID) | CUTANEOUS | 0 refills | Status: AC

## 2016-01-12 MED FILL — CLINDAMYCIN PH 1% GEL: 1 | 30 days supply | Qty: 30 | Fill #0

## 2016-01-12 NOTE — Progress Notes (Signed)
    Subjective:  Stacy Phillips is a 25 y.o. female who [facility] today with a chief complaint of vaginal discharge.   HPI:  Vaginal Discharge.  Started a few days ago. White and thick. No foul odor. Consistent with past episodes of yeast infections. No abdominal pain.   Folliculitis. 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 She is concerned because she thinks that the damage may be permanent. She has noticed a few small lesions near the superior edge of her vagina. No drainage. No fevers. She has not tried any treatments. She has been seen by a dermatologist for the hyperpigmentation who recommended that she have laser treatment.   ROS: Per HPI  PMH: Smoking history reviewed.    Objective:  Physical Exam: BP 118/62   Pulse 86   Wt 150 lb (68 kg)   LMP 01/05/2016 (Approximate)   BMI 24.58 kg/m   Gen: NAD, resting comfortably Pulm: NWOB GU: Several inflamed hair follicles noted along medial thighs, labia, and suprapubic pad all in various stages of healing. Shaved pubic hair noted. Otherwise normal external genitalia. Scant amount of thick white discharge noted in vaginal vault.  MSK: no edema, cyanosis, or clubbing noted Skin: warm, dry Neuro: grossly normal, moves all extremities Psych: Normal affect and thought content  Results for orders placed or performed in visit on 01/12/16 (from the past 72 hour(s))  POCT Wet Prep Mellody Drown(Wet [place])     Status: Abnormal   Collection Time: 01/12/16  4:44 PM  Result Value Ref Range   Source Wet Prep POC vaginal    WBC, Wet Prep HPF POC 5-10    Bacteria Wet Prep HPF POC Many (A) None, Few, Too numerous to count   Clue Cells Wet Prep HPF POC Moderate (A) None, Too numerous to count   Clue Cells Wet Prep Whiff POC Positive Whiff    Yeast Wet Prep HPF POC Few    Trichomonas Wet Prep HPF POC Absent Absent     Assessment/Plan:  Folliculitis Will treat with topical hydrocortisone  cream to areas of active inflammation. Will start prophylactic regimen with clindagel and benzoyl peroxide wash. Instructed patient to stop shaving. Follow up as needed.  Vaginal Discharge Yet prep with yeast and BV. Will treat with diflucan and flagyl. Return precautions reviewed.   Katina Degreealeb M. Jimmey RalphParker, MD [facility] Resident PGY-3 01/12/2016 5:05 PM

## 2016-01-12 NOTE — Patient Instructions (Addendum)
You have inflamed hair follicles. This is from you shaving.  We will send in a couple creams to treat the inflammation. Please use the  hydrocortisone cream to the areas of inflammation. Please use the clindagel daily.   Please use a wash containing benzoyl peroxide in the area once daily. You can usually find these products in the facial wash section.   You have BV and a yeast infection. We will treat you with 7 days of flagyl and 1 dose of diflucan.  Take care,  Dr Jimmey RalphParker

## 2016-01-12 NOTE — Assessment & Plan Note (Signed)
Will treat with topical hydrocortisone cream to areas of active inflammation. Will start prophylactic regimen with clindagel and benzoyl peroxide wash. Instructed patient to stop shaving. Follow up as needed.

## 2016-01-13 MED FILL — FLUCONAZOLE 150 MG TABLET: 150 | 6 days supply | Qty: 2 | Fill #0

## 2016-01-13 MED FILL — metroNIDAZOLE 500 MG TABS: 500 | 7 days supply | Qty: 14 | Fill #0

## 2016-02-10 ENCOUNTER — Telehealth: Payer: Self-pay | Admitting: Internal Medicine

## 2016-02-10 ENCOUNTER — Ambulatory Visit: Payer: 59 | Admitting: Family Medicine

## 2016-02-10 MED ORDER — METRONIDAZOLE 0.75 % VA GEL: VAGINAL | 0 refills | Status: DC

## 2016-02-10 MED FILL — VANDAZOLE VAGINAL 0.75% GEL: 0.75 | 7 days supply | Qty: 70 | Fill #0

## 2016-02-10 NOTE — Telephone Encounter (Signed)
Pharmacy told pt they had questions about the dosage on the BV medication. Please verify with the pharnacy

## 2016-02-10 NOTE — Telephone Encounter (Signed)
Her BV has never gone away. Would like refill on the pills.   [facility] on [address]

## 2016-02-24 ENCOUNTER — Ambulatory Visit (INDEPENDENT_AMBULATORY_CARE_PROVIDER_SITE_OTHER): Payer: 59 | Admitting: Family Medicine

## 2016-02-24 ENCOUNTER — Ambulatory Visit: Payer: 59 | Admitting: Student

## 2016-02-24 ENCOUNTER — Encounter: Payer: Self-pay | Admitting: Family Medicine

## 2016-02-24 ENCOUNTER — Other Ambulatory Visit (HOSPITAL_COMMUNITY)
Admission: RE | Admit: 2016-02-24 | Discharge: 2016-02-24 | Disposition: A | Discharge status: Home / Self Care | Payer: 59 | Source: Ambulatory Visit | Attending: Family Medicine | Admitting: Family Medicine

## 2016-02-24 ENCOUNTER — Ambulatory Visit: Payer: 59 | Admitting: Family Medicine

## 2016-02-24 VITALS — BP 120/72 | HR 93 | Temp 98.3°F | Ht 65.5 in | Wt 153.0 lb

## 2016-02-24 DIAGNOSIS — L989 Disorder of the skin and subcutaneous tissue, unspecified: Secondary | ICD-10-CM

## 2016-02-24 DIAGNOSIS — Z113 Encounter for screening for infections with a predominantly sexual mode of transmission: Secondary | ICD-10-CM | POA: Diagnosis not present

## 2016-02-24 DIAGNOSIS — N898 Other specified noninflammatory disorders of vagina: Secondary | ICD-10-CM

## 2016-02-24 LAB — POCT WET PREP (WET MOUNT)
Clue Cells Wet Prep Whiff POC: POSITIVE
Trichomonas Wet Prep HPF POC: ABSENT

## 2016-02-24 MED ORDER — METRONIDAZOLE 500 MG PO TABS: 500.0000 mg | ORAL_TABLET | Freq: Two times a day (BID) | ORAL | 0 refills | Status: DC

## 2016-02-24 MED ORDER — FLUCONAZOLE 150 MG PO TABS: 150.0000 mg | ORAL_TABLET | Freq: Once | ORAL | 0 refills | Status: AC

## 2016-02-24 NOTE — Patient Instructions (Addendum)
I will contact you will the results of your labs.  If anything is abnormal, I will call you.  Otherwise, expect a copy to be mailed to you.   It appears that you have something called hidradenitis suppurativa.  This is a common skin condition where you are prone to infection/ abscess formation.    What YOU can do to decrease recurrence:  1. Wear loose, light clothing  2. Avoid excessive heat, friction, and shearing trauma (protect areas that rub together: thighs, underneath breasts, underneath belly) 3. Wash clothes in detergents that are free of perfumes & dyes (usually marketed as "free and clear") 4. Wash DAILY. Use a gentle, nonsoap cleanser and to wash gently with only your fingers. Scrubbing with washcloths, loofahs, or brushes causes trauma and irritation. If you feel like you have an odor, you can use an antibacterial soap like Dial. 5. If you smoke, STOP. 6. Weight loss.  This is probably the most important one of all if you are overweight.  Excess weight causes hormonal imbalance, insulin resistance and increased shearing forces on the skin.  These ALL lead to increased risk of having infection. 7. Avoid shaving 8. Avoid deodorant 9. Avoidance of dairy (milk, cheese, yogurt, cream).  Some studies have shown that eliminating dairy from your diet has improved symptoms in as soon as 2 weeks.  Make sure to take a daily multivitamin if you choose to do this. 10. Apply a warm compress/ washcloth to affected area several times daily.  This will help the abscess open up, drain and heal.  What should you do if none of the above is helping?  Come back and see me.  We may need to put you on antibiotics, drain your infection in the office or refer you to a dermatologist.

## 2016-02-24 NOTE — Progress Notes (Signed)
Subjective: CC: vaginal discharge ZOX:[id number]:Stacy Phillips is a 25 y.o. female presenting to clinic today for same day appointment. PCP: Tarri AbernethyAbigail J Lancaster, MD Concerns today include:  1. Vaginal Discharge Patient reports that discharge started over a month ago.  She reports good response to initial treatment.  She reports the second treatment did not seem to help.  She used to gel with no improvement, possibly worsening.  She notes that discharge appears clear/ white.  She endorse strong vaginal odors.  She denies vaginal pruritis, abnormal vaginal bleeding, dysuria, hematuria, pelvic pain, nausea, vomiting, fevers, dyspareunia, post-coital bleeding.   She has used both oral and topical applications with little relief.   No history of STIs.  She is sexually active and does use condoms most of the time.  Contraception: none.  She is not interested in chemical contraception.  Patient's last menstrual period was 02/11/2016 (approximate).  Allergies  Allergen Reactions  . Penicillins   . Sulfa Antibiotics    Social History Reviewed. FamHx and MedHx reviewed.  Please see EMR. ROS: Per HPI  Objective: Office vital signs reviewed. BP 120/72   Pulse 93   Temp 98.3 F (36.8 C) (Oral)   Ht 5' 5.5" (1.664 m)   Wt 153 lb (69.4 kg)   LMP 02/11/2016 (Approximate)   BMI 25.07 kg/m   Physical Examination:  General: Awake, alert, well nourished, No acute distress Cardio: regular rate Pulm: normal work of breathing on room air GU: external vaginal tissue with some scarring on the mons pubis region no other abnormalities appreciated, cervix midline, no punctate lesions on cervix appreciated, moderate thin gray discharge from cervical os, no bleeding, no cervical motion tenderness, no abdominal/ adnexal masses, vaginal odor present  Results for orders placed or performed in visit on 02/24/16 (from the past 24 hour(s))  POCT Wet Prep Mellody Drown(Wet [place])     Status: Abnormal   Collection Time: 02/24/16   3:51 PM  Result Value Ref Range   Source Wet Prep POC VAG    WBC, Wet Prep HPF POC 1-3    Bacteria Wet Prep HPF POC Moderate (A) Few   Clue Cells Wet Prep HPF POC Moderate (A) None   Clue Cells Wet Prep Whiff POC Positive Whiff    Yeast Wet Prep HPF POC None    Trichomonas Wet Prep HPF POC Absent Absent    Assessment/ Plan: 25 y.o. female   1. Vaginal discharge.  Appears to be Bacterial vaginosis on wet prep.  Has not had recent STI testing, so this was collected today.  She has had recurrent BV over the last month.  I suspect this largely from engaging in high risk sexual behaviors w/ multiple sexual partners.  HIV also tested today.  Safe sex practices reinforced.  Vaginal care discussed.  Recommended consideration for oral probiotics vs yogurt. - Cervicovaginal ancillary only - HIV antibody - POCT Wet Prep (Wet [place]) - metroNIDAZOLE (FLAGYL) 500 MG tablet; Take 1 tablet (500 mg total) by mouth 2 (two) times daily.  Dispense: 14 tablet; Refill: 0 - fluconazole (DIFLUCAN) 150 MG tablet; Take 1 tablet (150 mg total) by mouth once. May repeat in 3 days if still having symptoms  Dispense: 2 tablet; Refill: 0 - Recommended following up closely with PCP.  Could consider prophylaxis for BV in future.  2. Skin lesion.  Concern for possible hidradenitis suppurativa.  NO active lesions today but some scarring along mons that suggests she may have had flares in the past. - Skin  care reviewed - Handout given for HA - Follow up with pcp   Raliegh IpAshly M Meredith Mells, DO PGY-3, [facility]

## 2016-02-25 ENCOUNTER — Encounter: Payer: Self-pay | Admitting: Family Medicine

## 2016-02-25 LAB — HIV ANTIBODY (ROUTINE TESTING W REFLEX): HIV 1&2 Ab, 4th Generation: NONREACTIVE

## 2016-02-25 LAB — CERVICOVAGINAL ANCILLARY ONLY
Chlamydia: NEGATIVE
NEISSERIA GONORRHEA: NEGATIVE
Trichomonas: NEGATIVE

## 2016-02-25 MED FILL — FLUCONAZOLE 150 MG TABLET: 150 | 3 days supply | Qty: 2 | Fill #0

## 2016-02-25 MED FILL — metroNIDAZOLE 500 MG TABS: 500 | 7 days supply | Qty: 14 | Fill #0

## 2016-03-19 ENCOUNTER — Other Ambulatory Visit: Payer: Self-pay | Admitting: Internal Medicine

## 2016-03-19 ENCOUNTER — Other Ambulatory Visit: Payer: Self-pay | Admitting: Family Medicine

## 2016-03-19 DIAGNOSIS — N898 Other specified noninflammatory disorders of vagina: Secondary | ICD-10-CM

## 2016-03-19 NOTE — Telephone Encounter (Signed)
Would like refill on the yeast infection pill-the one single pill.  [facility]

## 2016-03-22 NOTE — Telephone Encounter (Signed)
Pt informed and scheduled for an appt. Deseree Blount, CMA  

## 2016-03-22 NOTE — Telephone Encounter (Signed)
Per pcp pt needs appt. Pt informed and scheduled. Myriam Brandhorst Bruna PotterBlount, CMA

## 2016-03-24 ENCOUNTER — Ambulatory Visit: Payer: 59 | Admitting: Family Medicine

## 2016-04-06 ENCOUNTER — Other Ambulatory Visit (HOSPITAL_COMMUNITY)
Admission: RE | Admit: 2016-04-06 | Discharge: 2016-04-06 | Disposition: A | Discharge status: Home / Self Care | Payer: 59 | Source: Ambulatory Visit | Attending: Family Medicine | Admitting: Family Medicine

## 2016-04-06 ENCOUNTER — Encounter: Payer: Self-pay | Admitting: Family Medicine

## 2016-04-06 ENCOUNTER — Ambulatory Visit (INDEPENDENT_AMBULATORY_CARE_PROVIDER_SITE_OTHER): Payer: 59 | Admitting: Family Medicine

## 2016-04-06 VITALS — BP 118/78 | HR 79 | Temp 98.0°F | Ht 65.5 in | Wt 155.4 lb

## 2016-04-06 DIAGNOSIS — N898 Other specified noninflammatory disorders of vagina: Secondary | ICD-10-CM

## 2016-04-06 DIAGNOSIS — Z113 Encounter for screening for infections with a predominantly sexual mode of transmission: Secondary | ICD-10-CM | POA: Insufficient documentation

## 2016-04-06 DIAGNOSIS — B373 Candidiasis of vulva and vagina: Secondary | ICD-10-CM | POA: Diagnosis not present

## 2016-04-06 DIAGNOSIS — B3731 Acute candidiasis of vulva and vagina: Secondary | ICD-10-CM

## 2016-04-06 LAB — POCT WET PREP (WET MOUNT)
Clue Cells Wet Prep Whiff POC: NEGATIVE
Trichomonas Wet Prep HPF POC: ABSENT

## 2016-04-06 MED ORDER — FLUCONAZOLE 150 MG PO TABS: 150.0000 mg | ORAL_TABLET | Freq: Once | ORAL | 0 refills | Status: AC

## 2016-04-06 MED FILL — FLUCONAZOLE 150 MG TAB: 150 | 1 days supply | Qty: 1 | Fill #0

## 2016-04-06 NOTE — Assessment & Plan Note (Signed)
Yeast seen on exam and wet prep.  - Prescription sent for Diflucan to pharmacy - No douching, cotton underwear, use fragrance free soaps

## 2016-04-06 NOTE — Patient Instructions (Addendum)
Thank you for coming in today, it was so nice to see you! Today we talked about:    Vaginal discharge: It looks like you have a yeast infection. I have sent a prescription for diflucan to your pharmacy.   Bumps on skin: You have signs of having sensitive skin. Please try to avoid waxing or shaving in the area we have irritation. Use normal Dove soap on the area and dry thoroughly after showering.  If we ordered any tests today, you will be notified via telephone of any abnormalities. If everything is normal you will get a letter in the mail.   If you have any questions or concerns, please do not hesitate to call the office at [phone]. You can also message me directly via MyChart.   Sincerely,  Anders Simmonds, MD    Vaginal Yeast infection, Adult Vaginal yeast infection is a condition that causes soreness, swelling, and redness (inflammation) of the vagina. It also causes vaginal discharge. This is a common condition. Some women get this infection frequently. What are the causes? This condition is caused by a change in the normal balance of the yeast (candida) and bacteria that live in the vagina. This change causes an overgrowth of yeast, which causes the inflammation. What increases the risk? This condition is more likely to develop in:  Women who take antibiotic medicines.  Women who have diabetes.  Women who take birth control pills.  Women who are pregnant.  Women who douche often.  Women who have a weak defense (immune) system.  Women who have been taking steroid medicines for a long time.  Women who frequently wear tight clothing. What are the signs or symptoms? Symptoms of this condition include:  White, thick vaginal discharge.  Swelling, itching, redness, and irritation of the vagina. The lips of the vagina (vulva) may be affected as well.  Pain or a burning feeling while urinating.  Pain during sex. How is this diagnosed? This condition is  diagnosed with a medical history and physical exam. This will include a pelvic exam. Your health care provider will examine a sample of your vaginal discharge under a microscope. Your health care provider may send this sample for testing to confirm the diagnosis. How is this treated? This condition is treated with medicine. Medicines may be over-the-counter or prescription. You may be told to use one or more of the following:  Medicine that is taken orally.  Medicine that is applied as a cream.  Medicine that is inserted directly into the vagina (suppository). Follow these instructions at home:  Take or apply over-the-counter and prescription medicines only as told by your health care provider.  Do not have sex until your health care provider has approved. Tell your sex partner that you have a yeast infection. That person should go to his or her health care provider if he or she develops symptoms.  Do not wear tight clothes, such as pantyhose or tight pants.  Avoid using tampons until your health care provider approves.  Eat more yogurt. This may help to keep your yeast infection from returning.  Try taking a sitz bath to help with discomfort. This is a warm water bath that is taken while you are sitting down. The water should only come up to your hips and should cover your buttocks. Do this 3-4 times per day or as told by your health care provider.  Do not douche.  Wear breathable, cotton underwear.  If you have diabetes, keep your blood  sugar levels under control. Contact a health care provider if:  You have a fever.  Your symptoms go away and then return.  Your symptoms do not get better with treatment.  Your symptoms get worse.  You have new symptoms.  You develop blisters in or around your vagina.  You have blood coming from your vagina and it is not your menstrual period.  You develop pain in your abdomen. This information is not intended to replace advice given to  you by your health care provider. Make sure you discuss any questions you have with your health care provider. Document Released: 12/02/2004 Document Revised: 08/06/2015 Document Reviewed: 08/26/2014 Elsevier Interactive Patient Education  2017 [employer]

## 2016-04-06 NOTE — Progress Notes (Signed)
   Subjective:    Patient ID: Stacy Phillips , female   DOB: 05-25-1990 , 26 y.o..   MRN: [id number]  HPI  Stacy Phillips is here for  Chief Complaint  Patient presents with  . Vaginal Discharge   VAGINAL DISCHARGE  Having vaginal discharge for 5 days. Medications tried: nothing Discharge consistency: creamy Discharge color: white Recent antibiotic use: no Sex in last month: yes Possible STD exposure:not that she knows of  Symptoms Fever: no Dysuria:no Vaginal bleeding: no Abdomen or Pelvic pain: no Back pain: no Genital sores or ulcers:no Rash: she does have some bumps in her groin that come and go Pain during sex: no Missed menstrual period: no  ROS see HPI Smoking Status noted  Past Medical History: Patient Active Problem List   Diagnosis Date Noted  . Vaginal candidiasis 04/06/2016  . Vaginal lesion 11/26/2015  . Acne 09/26/2015  . Folliculitis 09/02/2015  . Bacterial vaginosis 08/13/2014  . Postinflammatory hyperpigmentation 08/13/2014  . Healthcare maintenance 08/13/2014  . Migraine 12/21/2013   Social Hx:  reports that she has been smoking Cigarettes.  She has been smoking about 0.10 packs per day. She has never used smokeless tobacco.   Objective:   BP 118/78   Pulse 79   Temp 98 F (36.7 C) (Oral)   Ht 5' 5.5" (1.664 m)   Wt 155 lb 6.4 oz (70.5 kg)   SpO2 99%   BMI 25.47 kg/m  Physical Exam  Gen: NAD, alert, cooperative with exam, well-appearing GYN:  External genitalia within normal limits.  Vaginal mucosa pink, moist, normal rugae.  Nonfriable cervix without lesions, no bleeding noted on speculum exam. There is white creamy discharge seen. Bimanual exam revealed normal, nongravid uterus.  No cervical motion tenderness. No adnexal masses bilaterally.     Assessment & Plan:  Vaginal candidiasis Yeast seen on exam and wet prep.  - Prescription sent for Diflucan to pharmacy - No douching, cotton underwear, use fragrance free  soaps   Anders Simmondshristina Rayshawn Maney, MD [facility], PGY-2

## 2016-04-08 LAB — GC/CHLAMYDIA PROBE AMP (~~LOC~~) NOT AT ARMC
Chlamydia: NEGATIVE
Neisseria Gonorrhea: NEGATIVE

## 2016-04-20 ENCOUNTER — Telehealth: Payer: Self-pay

## 2016-04-20 NOTE — Telephone Encounter (Signed)
White team please inform patient we do not prescribe refills for antibiotics as antibiotics are only given for a certain amount of time. If she feels that she needs more antibiotics please have her come into the clinic for a visit. Thank you.   Anders Simmondshristina Mallerie Blok, MD [facility], PGY-2

## 2016-04-20 NOTE — Telephone Encounter (Signed)
Would like to get another dose of Flagyl [facility] Please call when done.  [phone]. Sunday SpillersSharon T Rondel Episcopo, CMA

## 2016-04-20 NOTE — Telephone Encounter (Signed)
Spoke to pt. Scheduled appt. Sunday SpillersSharon T Cameka Rae, CMA

## 2016-04-27 ENCOUNTER — Encounter: Payer: Self-pay | Admitting: Internal Medicine

## 2016-04-27 ENCOUNTER — Ambulatory Visit (INDEPENDENT_AMBULATORY_CARE_PROVIDER_SITE_OTHER): Payer: 59 | Admitting: Internal Medicine

## 2016-04-27 VITALS — BP 104/60 | HR 67 | Temp 98.0°F | Ht 65.5 in | Wt 152.0 lb

## 2016-04-27 DIAGNOSIS — N898 Other specified noninflammatory disorders of vagina: Secondary | ICD-10-CM

## 2016-04-27 DIAGNOSIS — B373 Candidiasis of vulva and vagina: Secondary | ICD-10-CM

## 2016-04-27 DIAGNOSIS — B3731 Acute candidiasis of vulva and vagina: Secondary | ICD-10-CM

## 2016-04-27 LAB — POCT WET PREP (WET MOUNT)
CLUE CELLS WET PREP WHIFF POC: NEGATIVE
Trichomonas Wet Prep HPF POC: ABSENT

## 2016-04-27 MED ORDER — FLUCONAZOLE 150 MG PO TABS: ORAL_TABLET | ORAL | 0 refills | Status: DC

## 2016-04-27 MED FILL — FLUCONAZOLE 150 MG TABLET: 150 | 9 days supply | Qty: 3 | Fill #0

## 2016-04-27 NOTE — Progress Notes (Addendum)
   [facility] Phone: [phone]   Date of Visit: 04/27/2016   HPI:  Vaginal Discharge and Pruritis:  - patient reports that she was seen for vaginal discharge late January (1/30) and was diagnosed with vaginal yeast infection. She was prescribed Diflucan per note. Reports symptoms did not improve with this medication. Also reports that starting last week, she started to get bumps on her mons pubis and labia majora that burn and itch and swollen. Reports she has not been sexually active since the last time she was seen in our clinic. No fevers or chills. No dysuria or urinary frequency.    ROS: See HPI.  PMFSH:   PHYSICAL EXAM: BP 104/60   Pulse 67   Temp 98 F (36.7 C) (Oral)   Ht 5' 5.5" (1.664 m)   Wt 152 lb (68.9 kg)   LMP  (LMP Unknown)   SpO2 99%   BMI 24.91 kg/m  Gen: NAD, non-toxic appearing GU: erythematous papules on the mons pubis and labia majora consistent with vulvar candidiasis. (not consistent with HSV). normal cervix, uterus and adnexa. White discharge noted in vaginal vault. Bimanual exam normal.    ASSESSMENT/PLAN:  Vulvovaginal candidiasis Wet prep with yeast.  - Diflucan 150mg  1 tab, repeat 2nd dose in 72 hours if still has symptoms, repeat 3rd dose in 72 hours after second dose if continued symptoms. This is due to the severe vulvar candidiasis that was noted on exam (per uptodate recommendations) (called patient and discussed plan. Patient requested work note for today. Printed and placed up front for pick up).    Palma HolterKanishka G Gunadasa, MD PGY 2 [facility]

## 2016-04-27 NOTE — Patient Instructions (Signed)
-  I will call you with the results

## 2016-04-29 NOTE — Progress Notes (Deleted)
   Subjective:    Patient ID: Stacy Phillips , female   DOB: 04/16/1990 , 26 y.o..   MRN: [id number]  HPI  Stacy CourierQumeisha Savoia is here for No chief complaint on file.   1. VAGINAL DISCHARGE  Having vaginal discharge for *** days. Medications tried: *** Discharge consistency: *** Discharge color: *** Recent antibiotic use: *** Sex in last month: *** Possible STD exposure:***  Symptoms Fever: *** Dysuria:*** Vaginal bleeding: *** Abdomen or Pelvic pain: *** Back pain: *** Genital sores or ulcers:*** Rash: *** Pain during sex: *** Missed menstrual period: ***  ROS see HPI Smoking Status noted   Review of Systems: Per HPI. All other systems reviewed and are negative.  Past Medical History: Patient Active Problem List   Diagnosis Date Noted  . Vaginal candidiasis 04/06/2016  . Vaginal lesion 11/26/2015  . Acne 09/26/2015  . Folliculitis 09/02/2015  . Bacterial vaginosis 08/13/2014  . Postinflammatory hyperpigmentation 08/13/2014  . Healthcare maintenance 08/13/2014  . Migraine 12/21/2013    Medications: reviewed and updated Current Outpatient Prescriptions  Medication Sig Dispense Refill  . cyclobenzaprine (FLEXERIL) 10 MG tablet Take 1 tablet (10 mg total) by mouth at bedtime as needed for muscle spasms. 30 tablet 0  . fluconazole (DIFLUCAN) 150 MG tablet Take one tablet every 72 hours. 3 tablet 0  . hydrocortisone cream 0.5 % Apply 1 application topically 2 (two) times daily. 30 g 0   No current facility-administered medications for this visit.     Social Hx:  reports that she has been smoking Cigarettes.  She has been smoking about 0.10 packs per day. She has never used smokeless tobacco.   Objective:   LMP  (LMP Unknown)  Physical Exam  Gen: NAD, alert, cooperative with exam, well-appearing HEENT: NCAT, PERRL, clear conjunctiva, oropharynx clear, supple neck Cardiac: Regular rate and rhythm, normal S1/S2, no murmur, no edema, capillary refill  brisk  Respiratory: Clear to auscultation bilaterally, no wheezes, non-labored breathing Gastrointestinal: soft, non tender, non distended, bowel sounds present Skin: no rashes, normal turgor  Neurological: no gross deficits.  Psych: good insight, normal mood and affect  Assessment & Plan:  No problem-specific Assessment & Plan notes found for this encounter.  No orders of the defined types were placed in this encounter.  No orders of the defined types were placed in this encounter.   Anders Simmondshristina Mariesa Grieder, MD [facility], PGY-2

## 2016-04-30 ENCOUNTER — Ambulatory Visit: Payer: 59 | Admitting: Family Medicine

## 2016-05-06 ENCOUNTER — Ambulatory Visit: Payer: 59 | Admitting: Internal Medicine

## 2016-05-06 ENCOUNTER — Other Ambulatory Visit: Payer: Self-pay | Admitting: Internal Medicine

## 2016-05-06 NOTE — Telephone Encounter (Signed)
Pt states she needs one more diflucan to clear up infection. Pt uses [facility]. ep

## 2016-05-06 NOTE — Telephone Encounter (Signed)
Please let patient know she needs to schedule an appointment. She has been treated with diflucan five times since November and I am concerned that her yeast infections have become resistant to this. We need to discuss other possible treatment options. Thanks! - AJL

## 2016-05-06 NOTE — Telephone Encounter (Signed)
Patient calling to ask for diflucan refill. Upon reviewing chart, patient has been seen for vaginal discharge five times since November and treated with diflucan five times. Her candidiasis seems to be worsening per notes from last visit about one week ago. I am concerned that she is becoming resistant to diflucan. Per UpToDate. Itroconazole and ketoconazole are other options, however LFTs may need to be obtained prior to beginning treatment regimen with either of these agents. There is also concerned that she may be resistant to these azoles as well, but it may be worth attempting treatment anyway. One study has shown improvement in symptoms with diflucan 200mg  twice weekly, however others have not shown improvement with an increased dose. I have asked for patient to schedule another appointment to be seen to discuss these options and obtain labs if necessary.

## 2016-05-11 ENCOUNTER — Encounter: Payer: Self-pay | Admitting: Internal Medicine

## 2016-05-11 ENCOUNTER — Other Ambulatory Visit (HOSPITAL_COMMUNITY)
Admission: RE | Admit: 2016-05-11 | Discharge: 2016-05-11 | Disposition: A | Discharge status: Home / Self Care | Payer: 59 | Source: Ambulatory Visit | Attending: Family Medicine | Admitting: Family Medicine

## 2016-05-11 ENCOUNTER — Ambulatory Visit (INDEPENDENT_AMBULATORY_CARE_PROVIDER_SITE_OTHER): Payer: 59 | Admitting: Internal Medicine

## 2016-05-11 VITALS — BP 120/62 | HR 86 | Temp 98.0°F | Ht 65.5 in | Wt 154.8 lb

## 2016-05-11 DIAGNOSIS — B373 Candidiasis of vulva and vagina: Secondary | ICD-10-CM

## 2016-05-11 DIAGNOSIS — N898 Other specified noninflammatory disorders of vagina: Secondary | ICD-10-CM | POA: Diagnosis not present

## 2016-05-11 DIAGNOSIS — Z01419 Encounter for gynecological examination (general) (routine) without abnormal findings: Secondary | ICD-10-CM | POA: Diagnosis not present

## 2016-05-11 DIAGNOSIS — B3731 Acute candidiasis of vulva and vagina: Secondary | ICD-10-CM

## 2016-05-11 DIAGNOSIS — Z113 Encounter for screening for infections with a predominantly sexual mode of transmission: Secondary | ICD-10-CM | POA: Insufficient documentation

## 2016-05-11 LAB — POCT WET PREP (WET MOUNT)
CLUE CELLS WET PREP WHIFF POC: NEGATIVE
TRICHOMONAS WET PREP HPF POC: ABSENT

## 2016-05-11 MED ORDER — ACYCLOVIR 400 MG PO TABS
400.0000 mg | ORAL_TABLET | Freq: Three times a day (TID) | ORAL | 0 refills | Status: DC
Start: 2016-05-11 — End: 2016-05-11

## 2016-05-11 MED ORDER — VALACYCLOVIR HCL 500 MG PO TABS: 1000.0000 mg | ORAL_TABLET | Freq: Every day | ORAL | 0 refills | Status: DC

## 2016-05-11 MED ORDER — TERCONAZOLE 0.8 % VA CREA: 1.0000 | TOPICAL_CREAM | Freq: Every day | VAGINAL | 0 refills | Status: DC

## 2016-05-11 MED ORDER — TERCONAZOLE 0.8 % VA CREA: 1.0000 | TOPICAL_CREAM | Freq: Every day | VAGINAL | 0 refills | Status: AC

## 2016-05-11 MED FILL — VALACYCLOVIR HCL 500 MG TAB: 500 | 2 days supply | Qty: 5 | Fill #0

## 2016-05-11 MED FILL — TERCONAZOLE 0.8% VAGINAL CR: 0.8 | 3 days supply | Qty: 20 | Fill #0

## 2016-05-11 MED FILL — ACYCLOVIR 400 MG TABLET: 400 | 10 days supply | Qty: 30 | Fill #0

## 2016-05-11 NOTE — Progress Notes (Signed)
   [facility] Noralee CharsAsiyah Mikell, MD Phone: [phone]  Reason For Visit:   # VAGINAL DISCHARGE  Having vaginal discharge for several months. Previously seen on 2/20 and 1/30 for candidal infection. Curd white discharge per patient. Used diflucan x 3 from last visit without much relief. Patient also suffering from small ulcerative lesions. Has burning and pain associated with these lesions. Patient indicates extreme discomfort. Indicates having a hx of herpes diagnosised many years back. However, none recently.  Medications tried: Diflucan Recent antibiotic use: None Sex in last month: Denies being sexual active in the last 2-3 months. Last partner was female. Has had a menstrual period and no concern about possibly pregnancy  Possible STD exposure:Herpes in the past   Symptoms Fever: None  Dysuria:None  Vaginal bleeding: None  Abdomen or Pelvic pain: None  Back pain: None  Genital sores or ulcers: yes  Pain during sex: None Missed menstrual period: None   ROS see HPI Smoking Status noted  Objective: BP 120/62   Pulse 86   Temp 98 F (36.7 C) (Oral)   Ht 5' 5.5" (1.664 m)   Wt 154 lb 12.8 oz (70.2 kg)   LMP  (LMP Unknown)   SpO2 95%   BMI 25.37 kg/m  Gen: NAD, alert, cooperative with exam GI: soft, non-tender, non-distended, bowel sounds present,  GU: external vaginal tissue swollen, small vertical long ulcerations on vulva, cervix midline, no punctate lesions on cervix appreciated, white curd like discharge in the vaginal vault  os, no cervical motion tenderness, no abdominal/ adnexal masses Skin: dry, intact, no rashes or lesions  Assessment/Plan: See problem based a/p  Vaginal lesion Vaginal lesions, concern for herpes previously diagnosis many years ago.  Thought negative last September will retest these lesions  - Valtrex 1000 mg for 5 days  - Herpes simplex virus culture - Would also make sure that if these symptoms continue patient  receives an RPR    Vaginal candidiasis Previously treated extensively for vaginal candida. Likely resistant to diflucan, will instead treat with terconazole.  - Follow up wet prep and G/C results  - Patient to use valtrex followed by terconazole  - Follow up as needed  - Did not want HIV as has been negative with recent checks and patient has not been sexually active

## 2016-05-11 NOTE — Assessment & Plan Note (Addendum)
Vaginal lesions, concern for herpes previously diagnosis many years ago.  Thought negative last September will retest these lesions  - Valtrex 1000 mg for 5 days  - Herpes simplex virus culture - Would also make sure that if these symptoms continue patient receives an RPR

## 2016-05-11 NOTE — Patient Instructions (Addendum)
Please take valtrex for 5 days. Will need to take it in the morning, afternoon and at night. Then follow up with terconazole cream for 3 days. Please follow-up in 2 weeks if no improvement.

## 2016-05-11 NOTE — Assessment & Plan Note (Addendum)
Previously treated extensively for vaginal candida. Likely resistant to diflucan, will instead treat with terconazole.  - Follow up wet prep and G/C results  - Patient to use valtrex followed by terconazole  - Follow up as needed  - Did not want HIV as has been negative with recent checks and patient has not been sexually active  - Precepted with Dr. Jennette KettleNeal

## 2016-05-12 LAB — CERVICOVAGINAL ANCILLARY ONLY
Chlamydia: NEGATIVE
Neisseria Gonorrhea: NEGATIVE

## 2016-05-13 LAB — CYTOLOGY - PAP: DIAGNOSIS: NEGATIVE

## 2016-05-14 ENCOUNTER — Other Ambulatory Visit: Payer: Self-pay | Admitting: Internal Medicine

## 2016-05-14 MED ORDER — VALACYCLOVIR HCL 500 MG PO TABS: 1000.0000 mg | ORAL_TABLET | Freq: Every day | ORAL | 0 refills | Status: DC

## 2016-05-14 NOTE — Telephone Encounter (Signed)
Pt calling to request refill of:  Name of Medication(s):  Valtrex Last date of OV:  05-11-16 Pharmacy:  [facility]  Will route refill request to Clinic RN.  Discussed with patient policy to call pharmacy for future refills.  Also, discussed refills may take up to 48 hours to approve or deny.  Stacy Phillips  Pt states valtrex worked. It is healing and clearing up, but not completely gone. ep

## 2016-05-18 ENCOUNTER — Ambulatory Visit: Payer: 59 | Admitting: Internal Medicine

## 2016-05-26 ENCOUNTER — Telehealth: Payer: Self-pay | Admitting: Internal Medicine

## 2016-05-26 NOTE — Telephone Encounter (Signed)
Recent call patient and let her know that her results from her appointment on March 6 are all negative. Hopefully she is feeling significantly better.

## 2016-05-27 NOTE — Telephone Encounter (Signed)
Called again, no answer, voicemail no setup.

## 2016-05-27 NOTE — Telephone Encounter (Signed)
Attempted to call pt, no voicemail set up. Will try again later. Stacy Phillips, CMA  

## 2016-06-22 ENCOUNTER — Ambulatory Visit (INDEPENDENT_AMBULATORY_CARE_PROVIDER_SITE_OTHER): Payer: 59 | Admitting: Internal Medicine

## 2016-06-22 ENCOUNTER — Encounter: Payer: Self-pay | Admitting: Internal Medicine

## 2016-06-22 VITALS — BP 100/66 | HR 94 | Temp 98.1°F | Ht 65.0 in | Wt 155.8 lb

## 2016-06-22 DIAGNOSIS — J302 Other seasonal allergic rhinitis: Secondary | ICD-10-CM | POA: Diagnosis not present

## 2016-06-22 DIAGNOSIS — J3089 Other allergic rhinitis: Secondary | ICD-10-CM | POA: Diagnosis not present

## 2016-06-22 MED ORDER — FLUTICASONE PROPIONATE 50 MCG/ACT NA SUSP: 2.0000 | Freq: Every day | NASAL | 6 refills | Status: AC

## 2016-06-22 MED ORDER — OXYMETAZOLINE HCL 0.05 % NA SOLN: 1.0000 | Freq: Two times a day (BID) | NASAL | 0 refills | Status: AC

## 2016-06-22 MED ORDER — CETIRIZINE HCL 10 MG PO TABS: 10.0000 mg | ORAL_TABLET | Freq: Every day | ORAL | 11 refills | Status: AC

## 2016-06-22 MED FILL — FLUTICASONE PROP 50 MCG SPR: 50 | 30 days supply | Qty: 16 | Fill #0

## 2016-06-22 NOTE — Progress Notes (Signed)
[facility] Progress Note  Subjective:  Stacy Phillips is a 26 y.o. female with history of seasonal allergies and migraines who presents for bad allergies and not feeling well.  #Allergies: - Says severe allergies have been an issue her entire life. Especially bad in the spring. Thinks it may be pollen but has never had official allergy testing. - The past few days her nose has been dripping nonstop "like a faucet".  - Also had hives on her back that develop if she itches. May have used a new detergent.  - Has taken an over-the-counter allergy medication from the [employer]--unsure of the name--that has not helped; taking 2 tablets every 4-6 hours - Has been feeling weak and tired since onset of symptoms - Has also noted large dark circles under her eyes - Not tried a nasal spray as she does not like how it feels - No fevers or sick contacts - Notes that she has had itchy sensation in her throat with certain fruits such as watermelon, kiwi, pineapple, and grapefruit no reaction if she drinks these as a liquid ROS: No cough, no sore throat, no dyspnea; has had associated headaches  Objective: Blood pressure 100/66, temperature 98.1 F (36.7 C), temperature source Oral, height  (1.651 m), weight 155 lb 12.8 oz (70.7 kg), last menstrual period 06/20/2016. Body mass index is 25.93 kg/m. Constitutional: Tired-appearing female in NAD HENT: Allergic shiners present. Swollen and erythematous nasal turbinates. Mild erythema of posterior oropharynx. Normal TMs bilaterally without fluid. No lymphadenopathy.  Cardiovascular: RRR, S1, S2, no m/r/g.  Pulmonary/Chest: Effort normal and breath sounds normal, no wheezing. No respiratory distress.  Abdominal: Soft. +BS, soft, NT, ND, no rebound or guarding.  Skin: Scattered hyperpigmented welts across back with excoriations  Vitals reviewed  Assessment/Plan: Environmental and seasonal allergies - Acute episode of allergic  rhinitis secondary to suspected environmental allergy - Recommended Afrin for up to 3 days along with Flonase for long-term use to improve nasal congestion - Recommended Zyrtec daily for welts and seasonal allergies - If welts continue with Zyrtec could also try her home hydrocortisone at sites - Referred to Allergist, as patient would like formal allergen testing as she considers her allergies severe enough that she may consider shots if deemed appropriate  Follow-up prn.  Dani Gobble, MD [facility], PGY-2

## 2016-06-22 NOTE — Assessment & Plan Note (Addendum)
-   Acute episode of allergic rhinitis secondary to suspected environmental allergy - Recommended Afrin for up to 3 days along with Flonase for long-term use to improve nasal congestion - Recommended Zyrtec daily for welts and seasonal allergies - If welts continue with Zyrtec could also try her home hydrocortisone at sites - Referred to Allergist, as patient would like formal allergen testing as she considers her allergies severe enough that she may consider shots if deemed appropriate

## 2016-06-22 NOTE — Patient Instructions (Signed)
Stacy Phillips,  I recommend using afrin nose spray twice daily for 1-3 days (do not use more than 3 days in a row or can make congestion worse!). Take flonase nasal spray 1-2 times a day to keep nasal congestion at bay. Try zyrtec daily during allergy season--this should cause less weakness/sleepiness than other medications.  You will get a call about the Allergy referral.  Best, Dr. Sampson Goon

## 2016-07-02 ENCOUNTER — Ambulatory Visit: Payer: 59 | Admitting: Internal Medicine

## 2016-07-05 ENCOUNTER — Encounter: Payer: Self-pay | Admitting: Student

## 2016-07-05 ENCOUNTER — Ambulatory Visit (INDEPENDENT_AMBULATORY_CARE_PROVIDER_SITE_OTHER): Payer: 59 | Admitting: Student

## 2016-07-05 ENCOUNTER — Ambulatory Visit: Payer: 59 | Admitting: Student

## 2016-07-05 ENCOUNTER — Other Ambulatory Visit (HOSPITAL_COMMUNITY)
Admission: RE | Admit: 2016-07-05 | Discharge: 2016-07-05 | Disposition: A | Discharge status: Home / Self Care | Payer: 59 | Source: Ambulatory Visit | Attending: Family Medicine | Admitting: Family Medicine

## 2016-07-05 VITALS — BP 100/62 | HR 88 | Temp 98.3°F | Wt 160.0 lb

## 2016-07-05 DIAGNOSIS — N898 Other specified noninflammatory disorders of vagina: Secondary | ICD-10-CM

## 2016-07-05 LAB — POCT WET PREP (WET MOUNT)
CLUE CELLS WET PREP WHIFF POC: POSITIVE
Trichomonas Wet Prep HPF POC: ABSENT

## 2016-07-05 MED ORDER — METRONIDAZOLE 500 MG PO TABS: 500.0000 mg | ORAL_TABLET | Freq: Two times a day (BID) | ORAL | 0 refills | Status: DC

## 2016-07-05 MED FILL — metroNIDAZOLE 500 MG TABS: 500 | 7 days supply | Qty: 14 | Fill #0

## 2016-07-05 NOTE — Patient Instructions (Signed)
Follow up as needed You will be called regarding any abnormal test results and treated as needed  Call the office at [phone] with questions or concerns

## 2016-07-05 NOTE — Progress Notes (Signed)
   Subjective:    Patient ID: Stacy Phillips, female    DOB: 1990/07/08, 26 y.o.   MRN: [id number]   CC: vagina discharge  HPI: 26 y/o F presents for vaginal discharge  Vaginal discharge - started 3 days ago - unsure if foul odor - no vaginal irritation - she recently had protected intercourse with a new partner - no abdominal pain  Smoking status reviewed  Review of Systems  Per HPI, else denies chest pain, shortness of breath,     Objective:  BP 100/62   Pulse 88   Temp 98.3 F (36.8 C) (Oral)   Wt 160 lb (72.6 kg)   LMP 06/20/2016 (Approximate)   SpO2 99%   BMI 26.63 kg/m  Vitals and nursing note reviewed  General: NAD Cardiac: RRR,  Respiratory: CTAB, normal effort. Skin: warm and dry, no rashes noted Neuro: alert and oriented, no focal deficits Pelvic: Normal EGBUS, normal vaginal canal, normal cervix with no CMT, normal mobile uterus, normal adnexa with no masses, no adnexal tenderness     Assessment & Plan:    Vaginal discharge Vaginal discharge without pain or other ed flag symptoms. Overall benign exam - wet prep today - GC/Ct collected, will treat as needed    Cathern Tahir A. Kennon Rounds MD, MS Family Medicine Resident PGY-3 Pager [phone]

## 2016-07-05 NOTE — Assessment & Plan Note (Addendum)
Vaginal discharge without pain or other ed flag symptoms. Overall benign exam - wet prep showed BV, treat with flagyl - GC/Ct collected, will treat as needed

## 2016-07-06 LAB — CERVICOVAGINAL ANCILLARY ONLY
Chlamydia: NEGATIVE
Neisseria Gonorrhea: NEGATIVE

## 2016-07-07 ENCOUNTER — Telehealth: Payer: Self-pay | Admitting: Student

## 2016-07-07 NOTE — Telephone Encounter (Signed)
Patient is aware. Stacy Phillips,CMA  

## 2016-07-07 NOTE — Telephone Encounter (Signed)
Please call the patient and tell her that her gonorrhea and chlamydia testing are normal

## 2016-07-27 ENCOUNTER — Ambulatory Visit: Payer: 59 | Admitting: Allergy and Immunology

## 2016-08-03 ENCOUNTER — Encounter: Payer: Self-pay | Admitting: Family Medicine

## 2016-08-03 ENCOUNTER — Other Ambulatory Visit (HOSPITAL_COMMUNITY)
Admission: RE | Admit: 2016-08-03 | Discharge: 2016-08-03 | Disposition: A | Discharge status: Home / Self Care | Payer: 59 | Source: Ambulatory Visit | Attending: Family Medicine | Admitting: Family Medicine

## 2016-08-03 ENCOUNTER — Ambulatory Visit (INDEPENDENT_AMBULATORY_CARE_PROVIDER_SITE_OTHER): Payer: 59 | Admitting: Family Medicine

## 2016-08-03 VITALS — BP 100/62 | HR 73 | Temp 97.7°F | Wt 155.0 lb

## 2016-08-03 DIAGNOSIS — N898 Other specified noninflammatory disorders of vagina: Secondary | ICD-10-CM | POA: Diagnosis not present

## 2016-08-03 DIAGNOSIS — L810 Postinflammatory hyperpigmentation: Secondary | ICD-10-CM | POA: Diagnosis not present

## 2016-08-03 LAB — POCT WET PREP (WET MOUNT)
Clue Cells Wet Prep Whiff POC: POSITIVE
TRICHOMONAS WET PREP HPF POC: ABSENT

## 2016-08-03 MED ORDER — CHLORHEXIDINE GLUCONATE 4 % EX LIQD: Freq: Every day | CUTANEOUS | 0 refills | Status: AC | PRN

## 2016-08-03 MED ORDER — METRONIDAZOLE 500 MG PO TABS: 500.0000 mg | ORAL_TABLET | Freq: Two times a day (BID) | ORAL | 0 refills | Status: DC

## 2016-08-03 MED FILL — metroNIDAZOLE 500 MG TABS: 500 | 7 days supply | Qty: 14 | Fill #0

## 2016-08-03 NOTE — Progress Notes (Signed)
   Subjective:  Stacy Phillips is a 26 y.o. female who [facility] today with a chief complaint of vaginal discharge.   HPI:  Vaginal Discharge Symptoms started 2 days ago. Discharge is thick and white. Does not have an odor. No fevers or chills. No abdominal pain. No nausea or vomiting.  Skin Hyperpigmentation Patient also concerned about areas of hyper pigmentation surrounding her vulva and groin. She has been told that she has folliculitis in the past but is concerned about the hyperpigmented areas. She would like a referal to dermatology for lightening therapy.   ROS: Per HPI  PMH: Smoking history reviewed.   Objective:  Physical Exam: BP 100/62   Pulse 73   Temp 97.7 F (36.5 C) (Oral)   Wt 155 lb (70.3 kg)   SpO2 99%   BMI 25.79 kg/m   Gen: NAD, resting comfortably GI: Normal bowel sounds present. Soft, Nontender, Nondistended. GU: Several scattered areas of hyperpigmentation noted throughout groin consistent with folliculitis. Normal external female genitalia. Scant amount of thick white discharge noted in vaginal vault. No CMT.  Skin: warm, dry Neuro: grossly normal, moves all extremities Psych: Normal affect and thought content  Results for orders placed or performed in visit on 08/03/16 (from the past 72 hour(s))  POCT Wet Prep Mellody Drown(Wet [place])     Status: Abnormal   Collection Time: 08/03/16  4:39 PM  Result Value Ref Range   Source Wet Prep POC VAG    WBC, Wet Prep HPF POC 0-3    Bacteria Wet Prep HPF POC Few Few   Clue Cells Wet Prep HPF POC Few (A) None   Clue Cells Wet Prep Whiff POC Positive Whiff    Yeast Wet Prep HPF POC None    Trichomonas Wet Prep HPF POC Absent Absent    Assessment/Plan:  Vaginal Discharge  Wet prep with clue cells. Will treat with course of flagyl. GC/CT sent. No signs of PID. Return precautions reviewed. Follow up as needed. If continues to have recurrent BV would consider treatment with intravaginal metrogel.    Folliculitis/Postinflammatory hyperpigmentation Referral to dermatology placed. Will start hibiclens to see if it helps reduce occurrence. Recommended patient to stop shaving as well.   Katina Degreealeb M. Jimmey RalphParker, MD [facility] Resident PGY-3 08/03/2016 4:51 PM

## 2016-08-03 NOTE — Patient Instructions (Signed)
Your swab was positive for BV. We will treat with a week of flagyl.  Your other swabs will come back later this week.  Try the hibiclens to see if it helps with folliculitis. Stopping shaving will also help. We sent in a referral to dermatology.  Take care,  Dr Jimmey RalphParker

## 2016-08-04 LAB — CERVICOVAGINAL ANCILLARY ONLY
Chlamydia: NEGATIVE
NEISSERIA GONORRHEA: NEGATIVE

## 2016-08-05 ENCOUNTER — Encounter: Payer: Self-pay | Admitting: Family Medicine

## 2016-08-09 ENCOUNTER — Telehealth: Payer: Self-pay | Admitting: Internal Medicine

## 2016-08-09 ENCOUNTER — Ambulatory Visit: Payer: 59 | Admitting: Allergy and Immunology

## 2016-08-09 NOTE — Telephone Encounter (Signed)
Message abappt given to pt

## 2016-08-09 NOTE — Telephone Encounter (Signed)
Called patient but was unable to lvm. If patient calls back, please advise her that she has been scheduled to see Dr. Any [place] on 09/06/2016 at 11:40 AM  [facility] [address]  [place], [place] [id number] [phone]  I will also send a letter to her home address.

## 2016-09-14 ENCOUNTER — Ambulatory Visit (INDEPENDENT_AMBULATORY_CARE_PROVIDER_SITE_OTHER): Payer: 59 | Admitting: Family Medicine

## 2016-09-14 ENCOUNTER — Other Ambulatory Visit: Payer: Self-pay | Admitting: Family Medicine

## 2016-09-14 ENCOUNTER — Other Ambulatory Visit (HOSPITAL_COMMUNITY)
Admission: RE | Admit: 2016-09-14 | Discharge: 2016-09-14 | Disposition: A | Discharge status: Home / Self Care | Payer: 59 | Source: Ambulatory Visit | Attending: Family Medicine | Admitting: Family Medicine

## 2016-09-14 ENCOUNTER — Encounter: Payer: Self-pay | Admitting: Family Medicine

## 2016-09-14 VITALS — BP 102/62 | HR 85 | Temp 99.0°F | Wt 149.0 lb

## 2016-09-14 DIAGNOSIS — N898 Other specified noninflammatory disorders of vagina: Secondary | ICD-10-CM | POA: Insufficient documentation

## 2016-09-14 DIAGNOSIS — Z113 Encounter for screening for infections with a predominantly sexual mode of transmission: Secondary | ICD-10-CM | POA: Insufficient documentation

## 2016-09-14 DIAGNOSIS — L989 Disorder of the skin and subcutaneous tissue, unspecified: Secondary | ICD-10-CM | POA: Diagnosis not present

## 2016-09-14 LAB — POCT WET PREP (WET MOUNT)
CLUE CELLS WET PREP WHIFF POC: POSITIVE
TRICHOMONAS WET PREP HPF POC: ABSENT

## 2016-09-14 MED ORDER — METRONIDAZOLE 500 MG PO TABS: 500.0000 mg | ORAL_TABLET | Freq: Two times a day (BID) | ORAL | 0 refills | Status: DC

## 2016-09-14 MED FILL — metroNIDAZOLE 500 MG TABS: 500 | 7 days supply | Qty: 14 | Fill #0

## 2016-09-14 NOTE — Progress Notes (Signed)
Subjective:     Stacy CourierQumeisha Haro is a 26 y.o. female who presents for evaluation of an abnormal vaginal discharge and lesion on her left thigh.   Vaginal Discharge:  This is a recurrent issue for her with several episodes over the past year.  Symptoms have been present for 1 week. Vaginal symptoms: discharge described as white and curd-like, lesions and odor. Contraception: condoms. She denies dyspareunia, pain, urinary symptoms of dysuria, hematuria, lower abdominal pain and urinary urgency and vulvar itching Sexually transmitted infection risk: possible STD exposure. Menstrual flow: regular every 28-30 days. She has had several sexual partners. She cannot recall the number in the past year. She uses condoms intermittently. She has sex with both men and women. She has vaginal, anal, and oral sex. She does not use any hormonal contraception.   Left thigh lesion: She states that she had a lesion on her left inner thigh for the past several months. She denies any discharge from it. It is not painful. She has had others before, but this is worst. It is not painful. Shaving the area makes it worse. She says that It is not red or itchy.   The following portions of the patient's history were reviewed and updated as appropriate: She  reports that she has been smoking Cigarettes.  She has been smoking about 0.10 packs per day. She has never used smokeless tobacco. She reports that she drinks about 0.6 oz of alcohol per week . She reports that she uses drugs..   Review of Systems Pertinent items are noted in HPI.    Objective:    Pelvic: cervix normal in appearance, external genitalia normal, no cervical motion tenderness, rectovaginal septum normal, uterus normal size, shape, and consistency and white vaginal discharge Skin: small, pink lesion with keloid on left medial, upper thigh. Not fluctuant, easily mobile    Assessment and Plan:   Vaginal discharge Vaginal discharge w/o pain or other red  flag symptoms. Benign exam. Pt is high risk for STI. Collected wet prep, GC/C, RPR, HIV. Will treat as needed as needed. Consider patient for possible Pre-Exposure Prophylaxis (PrEP) therapy due to high HIV exposure risk.   Benign skin lesion of thigh Painless skin lesion on right inner thigh. Not itching or increasing in size. Small keloid attached to it. Not fluctuant. No drainage. Conservative management was recommended at this time.   Screening for STD (sexually transmitted disease) Given high risk for STI, tested for HIV, Syphilis, CG/C.

## 2016-09-14 NOTE — Assessment & Plan Note (Signed)
Given high risk for STI, tested for HIV, Syphilis, CG/C.

## 2016-09-14 NOTE — Assessment & Plan Note (Signed)
Vaginal discharge w/o pain or other red flag symptoms. Benign exam. Pt is high risk for STI. Collected wet prep, GC/C, RPR, HIV. Will treat as needed as needed. Consider patient for possible Pre-Exposure Prophylaxis (PrEP) therapy due to high HIV exposure risk.

## 2016-09-14 NOTE — Assessment & Plan Note (Signed)
Painless skin lesion on right inner thigh. Not itching or increasing in size. Small keloid attached to it. Not fluctuant. No drainage. Conservative management was recommended at this time.

## 2016-09-14 NOTE — Patient Instructions (Signed)
It was a pleasure to see you today! Thank you for choosing [facility] for your primary care. Stacy Phillips was seen for Vaginal discharge. We will contact you with the results. Please feel free to contact us if there is any issue.   Best,  Thomes DinningBrad Inice Sanluis, MD, MS FAMILY MEDICINE RESIDENT - PGY1 09/14/2016 11:58 AM

## 2016-09-15 LAB — HIV ANTIBODY (ROUTINE TESTING W REFLEX): HIV Screen 4th Generation wRfx: NONREACTIVE

## 2016-09-15 LAB — RPR: RPR: NONREACTIVE

## 2016-09-16 ENCOUNTER — Telehealth: Payer: Self-pay

## 2016-09-16 LAB — CERVICOVAGINAL ANCILLARY ONLY
CHLAMYDIA, DNA PROBE: NEGATIVE
Neisseria Gonorrhea: NEGATIVE

## 2016-09-16 NOTE — Telephone Encounter (Signed)
Attempted to contact pt and inform of results, no answer, no option for VM. If pt patient calls back I can give results then.

## 2016-09-16 NOTE — Telephone Encounter (Signed)
-----   Message from Garnette GunnerAaron B Thompson, MD sent at 09/15/2016  6:04 PM EDT ----- Called pt around 12:15 today, but did not answer. No mailbox to leave voice message. Can you please retry to notify pt of rpr and lab results? Thx.

## 2016-09-16 NOTE — Progress Notes (Signed)
Patient is aware of results. Sharlene Mccluskey,CMA  

## 2016-09-30 ENCOUNTER — Other Ambulatory Visit (HOSPITAL_COMMUNITY)
Admission: RE | Admit: 2016-09-30 | Discharge: 2016-09-30 | Disposition: A | Discharge status: Home / Self Care | Payer: 59 | Source: Ambulatory Visit | Attending: Family Medicine | Admitting: Family Medicine

## 2016-09-30 ENCOUNTER — Encounter: Payer: Self-pay | Admitting: Family Medicine

## 2016-09-30 ENCOUNTER — Ambulatory Visit (INDEPENDENT_AMBULATORY_CARE_PROVIDER_SITE_OTHER): Payer: 59 | Admitting: Family Medicine

## 2016-09-30 VITALS — BP 114/62 | HR 63 | Temp 98.0°F | Ht 65.0 in | Wt 146.6 lb

## 2016-09-30 DIAGNOSIS — N898 Other specified noninflammatory disorders of vagina: Secondary | ICD-10-CM | POA: Diagnosis not present

## 2016-09-30 DIAGNOSIS — N760 Acute vaginitis: Secondary | ICD-10-CM | POA: Diagnosis not present

## 2016-09-30 DIAGNOSIS — B9689 Other specified bacterial agents as the cause of diseases classified elsewhere: Secondary | ICD-10-CM | POA: Diagnosis not present

## 2016-09-30 DIAGNOSIS — Z30011 Encounter for initial prescription of contraceptive pills: Secondary | ICD-10-CM

## 2016-09-30 LAB — POCT WET PREP (WET MOUNT)
Clue Cells Wet Prep Whiff POC: POSITIVE
Trichomonas Wet Prep HPF POC: ABSENT

## 2016-09-30 MED ORDER — METRONIDAZOLE 500 MG PO TABS: 500.0000 mg | ORAL_TABLET | Freq: Two times a day (BID) | ORAL | 0 refills | Status: DC

## 2016-09-30 MED ORDER — NORGESTIMATE-ETH ESTRADIOL 0.25-35 MG-MCG PO TABS: 1.0000 | ORAL_TABLET | Freq: Every day | ORAL | 2 refills | Status: AC

## 2016-09-30 MED FILL — metroNIDAZOLE 500 MG TABS: 500 | 7 days supply | Qty: 14 | Fill #0

## 2016-09-30 MED FILL — MONO-LINYAH 28 TABLET: 0.25-35 | 28 days supply | Qty: 28 | Fill #0

## 2016-09-30 NOTE — Progress Notes (Signed)
Subjective:    Stacy Phillips - 26 y.o. female MRN [id number]  Date of birth: March 18, 1990  HPI  Stacy Phillips is here for vaginal discharge and interest in contraception.  Vaginal discharge: Patient was seen in clinic on 09/14/16 for this complaint and was found to have bacterial vaginosis on wet prep.  GC/Chlamydia, RPR, HIV were tested and found to be negative at that time.  She was prescribed Flagyl and completed the entire course.  She says that her symptoms were completely gone until yesterday, when she had unprotected sex with a new female partner.  Following intercourse, she immediately noticed a foul odor and increased quantity of white discharge.  She says that every time she has sex with a new partner, she gets these symptoms.    Contraception Initiation: Patient is interested in oral contraception.  She says she does not want to start any medication that will cause weight gain, and she is not interested in Nexplanon or an IUD.  She took birth control pills during high school, and they worked well for her.  She says she will not have problems remembering to take a pill at the same time every day.  She is interested in a birth control method that will reduce the number of periods she has.      Health Maintenance:  Health Maintenance Due  Topic Date Due  . TETANUS/TDAP  02/15/2011    -  reports that she has quit smoking. Her smoking use included Cigarettes. She smoked 0.10 packs per day. She has never used smokeless tobacco. - Review of Systems: Per HPI. - Past Medical History: Patient Active Problem List   Diagnosis Date Noted  . Encounter for initial prescription of contraceptive pills 09/30/2016  . Screening for STD (sexually transmitted disease) 09/14/2016  . Vaginal discharge 07/05/2016  . Environmental and seasonal allergies 06/22/2016  . Vaginal candidiasis 04/06/2016  . Vaginal lesion 11/26/2015  . Acne 09/26/2015  . Folliculitis 09/02/2015  . Bacterial  vaginosis 08/13/2014  . Postinflammatory hyperpigmentation 08/13/2014  . Healthcare maintenance 08/13/2014  . Migraine 12/21/2013  . Benign skin lesion of thigh 10/05/2012   - Medications: reviewed and updated   Objective:   Physical Exam BP 114/62   Pulse 63   Temp 98 F (36.7 C) (Oral)   Ht 5\' 5"  (1.651 m)   Wt 146 lb 9.6 oz (66.5 kg)   LMP 09/18/2016 (Exact Date)   SpO2 98%   BMI 24.40 kg/m  Gen: NAD, alert, cooperative with exam, well-appearing CV: RRR, good S1/S2, no murmur, no edema Resp: CTABL, no wheezes, non-labored Genitourinary: Female genitalia: normal external genitalia, vulva, vagina, cervix, uterus and adnexa Vagina: normal appearing vagina with normal color and discharge, no lesions Cervix: cervical discharge present - white and copious Skin: no rashes, normal turgor  Psych: good insight, alert and oriented        Assessment & Plan:   Bacterial vaginosis Clue cells visualized on wet prep.  Discussed Tinidazole as an option for patient since she gets recurrent bacterial vaginosis, but patient declined this option because of its cost.  Will prescribe 7 day course of metronidazole 500 mg BID.  Encounter for initial prescription of contraceptive pills Prescribed Sprintec monophasic birth control pills for three months.  Patient advised to return after three months so that we can check her blood pressure and see how this medication is working for her.  Counseled patient on signs and symptoms of DVT and the importance of taking this medication  every day at about the same time every day.    Lezlie OctaveAmanda Winfrey, M.D. 09/30/2016, 5:07 PM PGY-1, [facility]

## 2016-09-30 NOTE — Assessment & Plan Note (Addendum)
Clue cells visualized on wet prep.  Discussed Tinidazole as an option for patient since she gets recurrent bacterial vaginosis, but patient declined this option because of its cost.  Will prescribe 7 day course of metronidazole 500 mg BID.

## 2016-09-30 NOTE — Patient Instructions (Signed)
Patient declined AVS.  Please refer to office visit note for patient instructions.

## 2016-09-30 NOTE — Assessment & Plan Note (Signed)
Prescribed Sprintec monophasic birth control pills for three months.  Patient advised to return after three months so that we can check her blood pressure and see how this medication is working for her.  Counseled patient on signs and symptoms of DVT and the importance of taking this medication every day at about the same time every day.

## 2016-10-01 LAB — CERVICOVAGINAL ANCILLARY ONLY
CHLAMYDIA, DNA PROBE: NEGATIVE
NEISSERIA GONORRHEA: NEGATIVE

## 2016-10-03 ENCOUNTER — Encounter (HOSPITAL_COMMUNITY): Payer: Self-pay | Admitting: Emergency Medicine

## 2016-10-03 ENCOUNTER — Emergency Department (HOSPITAL_COMMUNITY): Payer: 59

## 2016-10-03 ENCOUNTER — Emergency Department (HOSPITAL_COMMUNITY)
Admission: EM | Admit: 2016-10-03 | Discharge: 2016-10-03 | Disposition: A | Discharge status: Home / Self Care | Payer: 59 | Attending: Emergency Medicine | Admitting: Emergency Medicine

## 2016-10-03 DIAGNOSIS — Z041 Encounter for examination and observation following transport accident: Secondary | ICD-10-CM | POA: Diagnosis present

## 2016-10-03 DIAGNOSIS — M25561 Pain in right knee: Secondary | ICD-10-CM | POA: Diagnosis not present

## 2016-10-03 DIAGNOSIS — Z79899 Other long term (current) drug therapy: Secondary | ICD-10-CM | POA: Diagnosis not present

## 2016-10-03 DIAGNOSIS — S8992XA Unspecified injury of left lower leg, initial encounter: Secondary | ICD-10-CM | POA: Diagnosis not present

## 2016-10-03 DIAGNOSIS — Y999 Unspecified external cause status: Secondary | ICD-10-CM | POA: Insufficient documentation

## 2016-10-03 DIAGNOSIS — M25512 Pain in left shoulder: Secondary | ICD-10-CM | POA: Insufficient documentation

## 2016-10-03 DIAGNOSIS — M25562 Pain in left knee: Secondary | ICD-10-CM | POA: Diagnosis not present

## 2016-10-03 DIAGNOSIS — Y939 Activity, unspecified: Secondary | ICD-10-CM | POA: Diagnosis not present

## 2016-10-03 DIAGNOSIS — S8001XA Contusion of right knee, initial encounter: Secondary | ICD-10-CM | POA: Insufficient documentation

## 2016-10-03 DIAGNOSIS — S8002XA Contusion of left knee, initial encounter: Secondary | ICD-10-CM | POA: Insufficient documentation

## 2016-10-03 DIAGNOSIS — S8991XA Unspecified injury of right lower leg, initial encounter: Secondary | ICD-10-CM | POA: Diagnosis not present

## 2016-10-03 DIAGNOSIS — Y929 Unspecified place or not applicable: Secondary | ICD-10-CM | POA: Diagnosis not present

## 2016-10-03 DIAGNOSIS — Z87891 Personal history of nicotine dependence: Secondary | ICD-10-CM | POA: Insufficient documentation

## 2016-10-03 MED ORDER — IBUPROFEN 800 MG PO TABS
800.0000 mg | ORAL_TABLET | Freq: Once | ORAL | Status: AC
  Administered 2016-10-03: 800 mg via ORAL
  Filled 2016-10-03: qty 1

## 2016-10-03 MED ORDER — IBUPROFEN 800 MG PO TABS: 800.0000 mg | ORAL_TABLET | Freq: Three times a day (TID) | ORAL | 0 refills | Status: AC | PRN

## 2016-10-03 MED ORDER — METHOCARBAMOL 500 MG PO TABS: 500.0000 mg | ORAL_TABLET | Freq: Two times a day (BID) | ORAL | 0 refills | Status: AC
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
## 2016-10-03 NOTE — Discharge Instructions (Signed)
Use Tylenol up to 3 times a day as needed for pain. Take this with meals. Do not use other anti-inflammatories at the same time (Advil, Motrin, naproxen, Aleve) sees. You may also use Tylenol as needed for pain. Take Robaxin as needed for muscle pain or stiffness. You will likely continue to have increased pain and soreness over the next several days. Follow-up with your primary care provider in one week if your pain is not improving. Return to the emergency department if you develop worsening headache, vomiting, vision changes, syncope, or any new or worsening symptoms.

## 2016-11-01 ENCOUNTER — Telehealth: Payer: Self-pay | Admitting: Family Medicine

## 2016-11-01 ENCOUNTER — Other Ambulatory Visit: Payer: Self-pay | Admitting: Family Medicine

## 2016-11-01 MED ORDER — METRONIDAZOLE 500 MG PO TABS: 500.0000 mg | ORAL_TABLET | Freq: Two times a day (BID) | ORAL | 0 refills | Status: DC

## 2016-11-01 NOTE — Telephone Encounter (Signed)
Pt didn't finish her last round of meds for BV and it is back.  Please send in a RX to [facility] at [facility].  Pt made an appt and decided she wanted to make a refill request also

## 2016-11-01 NOTE — Telephone Encounter (Signed)
Refilled Stacy Phillips's Flagyl since she likely has recurrence of BV.  I got a notification that the medication couldn't be e-prescribed, but sometimes meds are sent even when this notification pops up, so I tried it anyway.  I will call her to let her know about this.

## 2016-11-01 NOTE — Telephone Encounter (Signed)
Patient reported that she did not finish her course of Flagyl and that her BV symptoms have returned.  I sent a refill of her previous Flagyl prescription to [facility] pharmacy for her.

## 2016-11-02 ENCOUNTER — Ambulatory Visit: Payer: 59 | Admitting: Family Medicine

## 2016-11-17 ENCOUNTER — Ambulatory Visit: Payer: 59 | Admitting: Family Medicine

## 2016-12-08 ENCOUNTER — Ambulatory Visit: Payer: 59 | Admitting: Internal Medicine

## 2016-12-10 ENCOUNTER — Ambulatory Visit (INDEPENDENT_AMBULATORY_CARE_PROVIDER_SITE_OTHER): Payer: 59 | Admitting: Family Medicine

## 2016-12-10 ENCOUNTER — Encounter: Payer: Self-pay | Admitting: Family Medicine

## 2016-12-10 VITALS — BP 104/68 | HR 64 | Temp 98.4°F | Ht 65.0 in | Wt 147.0 lb

## 2016-12-10 DIAGNOSIS — N898 Other specified noninflammatory disorders of vagina: Secondary | ICD-10-CM

## 2016-12-10 DIAGNOSIS — N760 Acute vaginitis: Secondary | ICD-10-CM

## 2016-12-10 DIAGNOSIS — B9689 Other specified bacterial agents as the cause of diseases classified elsewhere: Secondary | ICD-10-CM

## 2016-12-10 DIAGNOSIS — Z8742 Personal history of other diseases of the female genital tract: Secondary | ICD-10-CM | POA: Diagnosis not present

## 2016-12-10 LAB — POCT WET PREP (WET MOUNT)
CLUE CELLS WET PREP WHIFF POC: POSITIVE
Trichomonas Wet Prep HPF POC: ABSENT

## 2016-12-10 MED ORDER — METRONIDAZOLE 500 MG PO TABS: 500.0000 mg | ORAL_TABLET | Freq: Two times a day (BID) | ORAL | 0 refills | Status: DC

## 2016-12-10 MED ORDER — METRONIDAZOLE 0.75 % VA GEL: 1.0000 | Freq: Two times a day (BID) | VAGINAL | 0 refills | Status: DC

## 2016-12-10 NOTE — Patient Instructions (Signed)
It was a pleasure to see you today! Thank you for choosing [facility] for your primary care. Stacy Phillips was seen for vaginal discharge.   Our plans for today were:  Do not use any soap inside your vagina.   Use condoms to decrease your chances of getting this infection again.   Use the vaginal medicine AFTER finishing the oral medicine for 3 months.   Come back to see Dr. Frances Furbish about your recurrent vaginal problems.   You should return to our clinic to see Dr. Frances Furbish in 1 month for recurrent vaginal infections.   Best,  Dr. Chanetta Marshall

## 2016-12-10 NOTE — Progress Notes (Signed)
   CC: vaginal discharge  HPI Recurrent vaginal discharge + odor. No new sexual partners, however she has not been using condoms as recommended. She denies wanting repeat HIV or RPR testing today. Notes resolution of her vaginal discharge with medication. Also denies ever having been told not to use soap inside her vagina.No systemic symptoms, reports compliance with birth control.   ROS: denies chest pain, shortness of breath, abdominal pain, dysuria, rashes.  CC, SH/smoking status, and VS noted  Objective: BP 104/68   Pulse 64   Temp 98.4 F (36.9 C) (Oral)   Ht  (1.651 m)   Wt 147 lb (66.7 kg)   SpO2 93% Comment: nails really long finger not completely on  BMI 24.46 kg/m  Gen: NAD, alert, cooperative thin female.  HEENT: NCAT, EOMI, PERRL CV: RRR, no murmur Resp: CTAB, no wheezes, non-labored Abd: SNTND, BS present, no guarding or organomegaly GU: No cervical motion tenderness, normal appearing external female genitalia, thin white malodorous discharge in posterior vault.  Ext: No edema, warm Neuro: Alert and oriented, Speech clear, No gross deficits  Assessment and plan:  Vaginal discharge Patient does not want STD testing today, but prepped consistent with recurrent BV. Patient counseled extensively about not using soaps inside or around her vagina, as well as using condoms. Patient denies new sexual partners. Given two-week course of metronidazole, as well as suppressive vaginal metronidazole gel to use for 3 months. Counseled patient to return in one month to revisit recurrent vaginal infections with PCP, would possibly consider GYN referral. Patient seems to have limited knowledge regarding what causes BV and vaginal discharge.   Orders Placed This Encounter  Procedures  . POCT Wet Prep [facility])    Meds ordered this encounter  Medications  . DISCONTD: metroNIDAZOLE (FLAGYL) 500 MG tablet    Sig: Take 1 tablet (500 mg total) by mouth 2 (two) times daily.   Dispense:  28 tablet    Refill:  0  . metroNIDAZOLE (METROGEL) 0.75 % vaginal gel    Sig: Place 1 Applicatorful vaginally 2 (two) times daily. Start using AFTER finishing the oral medicine.    Dispense:  70 g    Refill:  0  . metroNIDAZOLE (FLAGYL) 500 MG tablet    Sig: Take 1 tablet (500 mg total) by mouth 2 (two) times daily.    Dispense:  28 tablet    Refill:  0    Loni Muse, MD, PGY2 12/10/2016 10:04 AM

## 2016-12-10 NOTE — Assessment & Plan Note (Signed)
Patient does not want STD testing today, but prepped consistent with recurrent BV. Patient counseled extensively about not using soaps inside or around her vagina, as well as using condoms. Patient denies new sexual partners. Given two-week course of metronidazole, as well as suppressive vaginal metronidazole gel to use for 3 months. Counseled patient to return in one month to revisit recurrent vaginal infections with PCP, would possibly consider GYN referral. Patient seems to have limited knowledge regarding what causes BV and vaginal discharge.

## 2016-12-17 MED FILL — metroNIDAZOLE 0.75 % GEL: 0.75 | 3 days supply | Qty: 70 | Fill #0

## 2016-12-17 MED FILL — metroNIDAZOLE 500 MG TABS: 500 | 14 days supply | Qty: 28 | Fill #0

## 2017-01-25 ENCOUNTER — Ambulatory Visit: Payer: 59 | Admitting: Family Medicine

## 2017-01-25 ENCOUNTER — Ambulatory Visit: Payer: 59 | Admitting: Student

## 2017-01-25 NOTE — Progress Notes (Deleted)
   Subjective   Patient ID: Stacy Phillips    DOB: 1990-10-14, 26 y.o. female   MRN: [id number]  CC: "***"  HPI: Stacy Phillips is a 26 y.o. female who presents for a same day appointment for the following:  *** ***Recently seen 10/5 for vaginal discharge.  DD checking.  Prep consistent with BV and treated appropriately with 2-week course of Flagyl by mouth in addition to MetroGel after completion of medication.  ROS: see HPI for pertinent.  PMFSH: History of genital herpes, BV and vaginal candidiasis, migraines, acne.  Surgical history unremarkable.  Family history HTN.  Smoking status reviewed. Medications reviewed.  Objective   There were no vitals taken for this visit. Vitals and nursing note reviewed.  General: well nourished, well developed, NAD with non-toxic appearance HEENT: normocephalic, atraumatic, moist mucous membranes Neck: supple, non-tender without lymphadenopathy Cardiovascular: regular rate and rhythm without murmurs, rubs, or gallops Lungs: clear to auscultation bilaterally with normal work of breathing Abdomen: soft, non-tender, non-distended, normoactive bowel sounds Skin: warm, dry, no rashes or lesions, cap refill < 2 seconds Extremities: warm and well perfused, normal tone, no edema  Assessment & Plan   No problem-specific Assessment & Plan notes found for this encounter.  No orders of the defined types were placed in this encounter.  No orders of the defined types were placed in this encounter.   Durward Parcelavid McMullen, DO [facility], PGY-2 01/25/2017, 11:40 AM

## 2017-01-26 ENCOUNTER — Ambulatory Visit: Payer: 59 | Admitting: Internal Medicine

## 2017-01-31 ENCOUNTER — Ambulatory Visit: Payer: 59 | Admitting: Internal Medicine

## 2017-02-07 ENCOUNTER — Encounter: Payer: Self-pay | Admitting: Student

## 2017-02-07 ENCOUNTER — Other Ambulatory Visit (HOSPITAL_COMMUNITY)
Admission: RE | Admit: 2017-02-07 | Discharge: 2017-02-07 | Disposition: A | Discharge status: Home / Self Care | Payer: 59 | Source: Ambulatory Visit | Attending: Family Medicine | Admitting: Family Medicine

## 2017-02-07 ENCOUNTER — Ambulatory Visit (INDEPENDENT_AMBULATORY_CARE_PROVIDER_SITE_OTHER): Payer: 59 | Admitting: Student

## 2017-02-07 VITALS — BP 102/66 | HR 75 | Temp 97.8°F | Ht 65.0 in | Wt 154.0 lb

## 2017-02-07 DIAGNOSIS — L739 Follicular disorder, unspecified: Secondary | ICD-10-CM

## 2017-02-07 DIAGNOSIS — N760 Acute vaginitis: Secondary | ICD-10-CM | POA: Diagnosis not present

## 2017-02-07 DIAGNOSIS — N898 Other specified noninflammatory disorders of vagina: Secondary | ICD-10-CM | POA: Insufficient documentation

## 2017-02-07 DIAGNOSIS — Z7251 High risk heterosexual behavior: Secondary | ICD-10-CM

## 2017-02-07 DIAGNOSIS — B9689 Other specified bacterial agents as the cause of diseases classified elsewhere: Secondary | ICD-10-CM | POA: Diagnosis not present

## 2017-02-07 DIAGNOSIS — N949 Unspecified condition associated with female genital organs and menstrual cycle: Secondary | ICD-10-CM

## 2017-02-07 LAB — POCT WET PREP (WET MOUNT)
CLUE CELLS WET PREP WHIFF POC: NEGATIVE
Trichomonas Wet Prep HPF POC: ABSENT

## 2017-02-07 LAB — POCT URINE PREGNANCY: Preg Test, Ur: NEGATIVE

## 2017-02-07 MED ORDER — METRONIDAZOLE 0.75 % VA GEL: VAGINAL | 6 refills | Status: AC

## 2017-02-07 MED ORDER — METRONIDAZOLE 500 MG PO TABS: 500.0000 mg | ORAL_TABLET | Freq: Two times a day (BID) | ORAL | 0 refills | Status: DC

## 2017-02-07 MED FILL — metroNIDAZOLE 500 MG TABS: 500 | 7 days supply | Qty: 14 | Fill #0

## 2017-02-07 NOTE — Progress Notes (Signed)
Subjective:    Stacy Phillips is a 26 y.o. old female here vaginal discharge and vaginal lesion  HPI Vaginal discharge: this is a chronic issue. She hasn't used the metrogel yet. Clear in nature. Denies itching. Got odor. Denies dysuria.   Vaginal lesion: this has been going on for years. The entire vaginal is a kind of swollen. Not painful at rest but tender. Had similar lesion intermittently in the past. She is sexually active with the same female partner in last 6 months. She is breaking up with him now. Not using condom. Not on birth control. Not sure about LMP. Period is irregular.   PMH/Problem List: has Benign skin lesion of thigh; Migraine; Bacterial vaginosis; Postinflammatory hyperpigmentation; Healthcare maintenance; Folliculitis; Acne; Vaginal lesion; Vaginal candidiasis; Environmental and seasonal allergies; Vaginal discharge; Screening for STD (sexually transmitted disease); and Encounter for initial prescription of contraceptive pills on their problem list.   has a past medical history of Genital herpes (2012).  FH:  Family History  Problem Relation Age of Onset  . Cancer Maternal Grandmother   . Hypertension Maternal Grandfather   . Hypertension Father     SH Social History   Tobacco Use  . Smoking status: Former Smoker    Packs/day: 0.10    Types: Cigarettes  . Smokeless tobacco: Never Used  Substance Use Topics  . Alcohol use: Yes    Alcohol/week: 0.6 oz    Types: 1 Standard drinks or equivalent per week  . Drug use: No    Comment: daily marijuana    Review of Systems Review of systems negative except for pertinent positives and negatives in history of present illness above.     Objective:     Vitals:   02/07/17 1134  BP: 102/66  Pulse: 75  Temp: 97.8 F (36.6 C)  TempSrc: Oral  SpO2: 97%  Weight: 154 lb (69.9 kg)  Height: 5\' 5"  (1.651 m)   Body mass index is 25.63 kg/m.  Physical Exam  GEN: appears well, no apparent distress. Eyes: conjunctiva  without injection, sclera anicteric Oropharynx: mmm without erythema or exudation HEM: negative for inguinal LAD CVS: RRR, nl s1 & s2, no murmurs, no edema RESP: no IWOB GI: BS present & normal, soft, NTND GU:  Pelvic Exam No suprapubic or CVA tenerness External genitalia: with folliculitis, no pus or blisters, no obvious discharge or bleeeding.  Speculum: pink vaginal mucosa, ruggated, normal cervix & discharge. No lesion Bimanual: no cervical motion tenderness or adnexal mass. Uterus appears normal size  MSK: no focal tenderness or notable swelling SKIN: pubic and perivaginal folliculitis NEURO: alert and oiented appropriately, no gross deficits  PSYCH: euthymic mood with congruent affect    Assessment and Plan:  1. Bacterial vaginosis: recurrent. Five times in less than one year. Not using condoms. Was counseled on conservative management by PCP in the past. Didn't use her Metrogel cream either. Now breaking up with partner. This may change the flora. Will treat with metronidazole 500 mg twice a day for 7 days followed by Metrogel vaginal cream every three days for one week . Pregnancy test negative  2. Folliculitis: suggested using hair trimmer vs shaver.   3. Unprotected sex: discussed the importance of using protection such as condoms and using birth control until ready to have child.  - HSV(herpes smplx)abs-1+2(IgG+IgM)-bld - Hepatitis B core antibody, IgM - Hepatitis B surface antigen - HIV antibody - RPR - POCT urine pregnancy - Hepatitis B surface antibody  Return if symptoms worsen or fail  to improve.  Almon Herculesaye T Ane Conerly, MD 02/08/17 Pager: [phone]

## 2017-02-07 NOTE — Patient Instructions (Addendum)
It was great seeing you today! We have addressed the following issues today  Vaginal discharge: We have does not tests today.  We will let you know about the results when we have them back in the next 2-3 days.   Pubic/Vaginal lesions: At is likely due to folliculitis from shaving.  Recommend using hair trimmer.  See below for more information on this.  If we did any lab work today, and the results require attention, either me or my nurse will get in touch with you. If everything is normal, you will get a letter in mail and a message via . If you don't hear from us in two weeks, please give us a call. Otherwise, we look forward to seeing you again at your next visit. If you have any questions or concerns before then, please call the clinic at [phone].  Please bring all your medications to every doctors visit  Sign up for My Chart to have easy access to your labs results, and communication with your Primary care physician.    Please check-out at the front desk before leaving the clinic.    Take Care,   Dr. Alanda SlimGonfa   Folliculitis Folliculitis is inflammation of the hair follicles. Folliculitis most commonly occurs on the scalp, thighs, legs, back, and buttocks. However, it can occur anywhere on the body. What are the causes? This condition may be caused by:  A bacterial infection (common).  A fungal infection.  A viral infection.  Coming into contact with certain chemicals, especially oils and tars.  Shaving or waxing.  Applying greasy ointments or creams to your skin often.  Long-lasting folliculitis and folliculitis that keeps coming back can be caused by bacteria that live in the nostrils. What increases the risk? This condition is more likely to develop in people with:  A weakened immune system.  Diabetes.  Obesity.  What are the signs or symptoms? Symptoms of this condition include:  Redness.  Soreness.  Swelling.  Itching.  Small white or yellow,  pus-filled, itchy spots (pustules) that appear over a reddened area. If there is an infection that goes deep into the follicle, these may develop into a boil (furuncle).  A group of closely packed boils (carbuncle). These tend to form in hairy, sweaty areas of the body.  How is this diagnosed? This condition is diagnosed with a skin exam. To find what is causing the condition, your health care provider may take a sample of one of the pustules or boils for testing. How is this treated? This condition may be treated by:  Applying warm compresses to the affected areas.  Taking an antibiotic medicine or applying an antibiotic medicine to the skin.  Applying or bathing with an antiseptic solution.  Taking an over-the-counter medicine to help with itching.  Having a procedure to drain any pustules or boils. This may be done if a pustule or boil contains a lot of pus or fluid.  Laser hair removal. This may be done to treat long-lasting folliculitis.  Follow these instructions at home:  If directed, apply heat to the affected area as often as told by your health care provider. Use the heat source that your health care provider recommends, such as a moist heat pack or a heating pad. ? Place a towel between your skin and the heat source. ? Leave the heat on for 20-30 minutes. ? Remove the heat if your skin turns bright red. This is especially important if you are unable to feel  pain, heat, or cold. You may have a greater risk of getting burned.  If you were prescribed an antibiotic medicine, use it as told by your health care provider. Do not stop using the antibiotic even if you start to feel better.  Take over-the-counter and prescription medicines only as told by your health care provider.  Do not shave irritated skin.  Keep all follow-up visits as told by your health care provider. This is important. Get help right away if:  You have more redness, swelling, or pain in the affected  area.  Red streaks are spreading from the affected area.  You have a fever. This information is not intended to replace advice given to you by your health care provider. Make sure you discuss any questions you have with your health care provider. Document Released: 05/03/2001 Document Revised: 09/12/2015 Document Reviewed: 12/13/2014 Elsevier Interactive Patient Education  2018 [employer]

## 2017-02-08 ENCOUNTER — Encounter: Payer: Self-pay | Admitting: Student

## 2017-02-08 LAB — HEPATITIS B SURFACE ANTIBODY, QUANTITATIVE: Hepatitis B Surf Ab Quant: 48 m[IU]/mL (ref >9.9)

## 2017-02-09 LAB — HSV(HERPES SMPLX)ABS-I+II(IGG+IGM)-BLD
HSV 1 Glycoprotein G Ab, IgG: 19.8 index — ABNORMAL HIGH (ref 0.00–0.90)
HSVI/II Comb IgM: 1.78 Ratio — ABNORMAL HIGH (ref 0.00–0.90)

## 2017-02-09 LAB — HEPATITIS B CORE ANTIBODY, IGM: Hep B C IgM: NEGATIVE

## 2017-02-09 LAB — HEPATITIS B SURFACE ANTIGEN: Hepatitis B Surface Ag: NEGATIVE

## 2017-02-09 LAB — CERVICOVAGINAL ANCILLARY ONLY
CHLAMYDIA, DNA PROBE: NEGATIVE
Neisseria Gonorrhea: NEGATIVE

## 2017-02-09 LAB — RPR: RPR: NONREACTIVE

## 2017-02-09 LAB — HIV ANTIBODY (ROUTINE TESTING W REFLEX): HIV SCREEN 4TH GENERATION: NONREACTIVE

## 2017-02-10 ENCOUNTER — Telehealth: Payer: Self-pay | Admitting: Student

## 2017-02-10 NOTE — Telephone Encounter (Signed)
Called and discussed lab results with the patient.  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  She has no current lesion.  Her GC/CT/HIV/hep B are negative

## 2017-02-15 ENCOUNTER — Other Ambulatory Visit: Payer: Self-pay | Admitting: Family Medicine

## 2017-02-15 MED ORDER — VALACYCLOVIR HCL 1 G PO TABS: 1000.0000 mg | ORAL_TABLET | Freq: Every day | ORAL | 0 refills | Status: DC

## 2017-02-15 MED FILL — VALACYCLOVIR HCL 500 MG TAB: 500 | 2 days supply | Qty: 5 | Fill #0

## 2017-02-15 NOTE — Telephone Encounter (Signed)
Routing to PCP as well as doctor who saw pt on 02/07/17. Lamonte SakaiZimmerman Rumple, April D, [place]

## 2017-02-15 NOTE — Telephone Encounter (Signed)
Sent Rx for Valtrex to her pharmacy.

## 2017-02-15 NOTE — Telephone Encounter (Signed)
Pt called and says when she came for appt on 12/3 and asked that she get a refill on valtrex, but dr wouldn't refill it. Pt is calling asking if it [facility] be [facility]. Pt says she has had this condition many times before and knows that is what she needs. She was out of town all weekend and with the snow couldn't get in touch with dr and is in excruciating pain and needs this refilled. Please advise

## 2017-02-21 ENCOUNTER — Telehealth: Payer: Self-pay | Admitting: Family Medicine

## 2017-02-21 MED ORDER — FLUCONAZOLE 150 MG PO TABS: ORAL_TABLET | ORAL | 0 refills | Status: DC

## 2017-02-21 MED FILL — FLUCONAZOLE 150 MG TABLET: 150 | 9 days supply | Qty: 3 | Fill #0

## 2017-02-21 NOTE — Telephone Encounter (Signed)
Rx for diflucan sent to her pharmacy.

## 2017-02-21 NOTE — Telephone Encounter (Signed)
At last visit she was given Rx for BV.  She needs a Rx for a yeast infection.  [facility].

## 2017-04-06 ENCOUNTER — Ambulatory Visit: Payer: 59 | Admitting: Family Medicine

## 2017-05-04 ENCOUNTER — Ambulatory Visit: Payer: 59 | Admitting: Family Medicine

## 2017-05-23 ENCOUNTER — Ambulatory Visit: Payer: 59 | Admitting: Family Medicine

## 2017-06-15 ENCOUNTER — Ambulatory Visit: Payer: 59 | Admitting: Family Medicine

## 2017-06-16 ENCOUNTER — Ambulatory Visit: Payer: 59 | Admitting: Internal Medicine

## 2017-06-21 ENCOUNTER — Other Ambulatory Visit (HOSPITAL_COMMUNITY)
Admission: RE | Admit: 2017-06-21 | Discharge: 2017-06-21 | Disposition: A | Discharge status: Home / Self Care | Payer: 59 | Source: Ambulatory Visit | Attending: Family Medicine | Admitting: Family Medicine

## 2017-06-21 ENCOUNTER — Encounter: Payer: Self-pay | Admitting: Internal Medicine

## 2017-06-21 ENCOUNTER — Ambulatory Visit (INDEPENDENT_AMBULATORY_CARE_PROVIDER_SITE_OTHER): Payer: 59 | Admitting: Internal Medicine

## 2017-06-21 ENCOUNTER — Other Ambulatory Visit: Payer: Self-pay

## 2017-06-21 VITALS — BP 120/72 | HR 60 | Temp 98.5°F | Ht 65.0 in | Wt 149.0 lb

## 2017-06-21 DIAGNOSIS — N898 Other specified noninflammatory disorders of vagina: Secondary | ICD-10-CM | POA: Diagnosis not present

## 2017-06-21 DIAGNOSIS — Z87891 Personal history of nicotine dependence: Secondary | ICD-10-CM | POA: Insufficient documentation

## 2017-06-21 LAB — POCT WET PREP (WET MOUNT)
Clue Cells Wet Prep Whiff POC: NEGATIVE
Trichomonas Wet Prep HPF POC: ABSENT

## 2017-06-21 NOTE — Patient Instructions (Addendum)
Ms. Jearl KlinefelterWilkerson,  I will call you with the results of the wet prep.   Congratulations on opening your own business!!  Look for yogurt that has "Lactobacillus".  Best, Dr. Sampson GoonFitzgerald

## 2017-06-22 ENCOUNTER — Telehealth: Payer: Self-pay | Admitting: *Deleted

## 2017-06-22 LAB — CERVICOVAGINAL ANCILLARY ONLY
CHLAMYDIA, DNA PROBE: NEGATIVE
Neisseria Gonorrhea: NEGATIVE

## 2017-06-22 NOTE — Telephone Encounter (Signed)
Notes recorded by Casey BurkittFitzgerald, Hillary Moen, MD on 06/21/2017 at 5:10 PM EDT Tried to call patient to let her know about negative wet prep--negative for yeast and BV. No voicemail setup. Please try to reach her 06/22/17. Thank you!

## 2017-06-22 NOTE — Telephone Encounter (Signed)
Pt informed and will await call about STD testing. Greydon Betke, Maryjo RochesterJessica Dawn, CMA

## 2017-06-24 ENCOUNTER — Encounter: Payer: Self-pay | Admitting: Internal Medicine

## 2017-06-24 NOTE — Progress Notes (Signed)
[facility] Progress Note  SubjectiveRedge Phillips:  Stacy CourierQumeisha Mclinden is a 27 y.o. female who presents for concern about vaginal discharge. She reports having multiple episodes of BV over the course of last year (per chart review testing positive on wet prep at least 4 times) and feels like she may be having another episode due to her vaginal discharge having an odor. She has not noted a particular cycle of when she has increased discharge and says her periods are irregular. She said she broke up with her last boyfriend because she thought his sperm could have been causing her recurrent flares. She has a new boyfriend but has not been sexually active with him. She uses dove soap and does not douche. She smokes marijuana but does not smoke tobacco. ROS: No fevers, no abdominal pain, no dysuria   Allergies  Allergen Reactions  . Penicillins   . Sulfa Antibiotics     Social History   Tobacco Use  . Smoking status: Former Smoker    Packs/day: 0.10    Types: Cigarettes  . Smokeless tobacco: Never Used  Substance Use Topics  . Alcohol use: Yes    Alcohol/week: 0.6 oz    Types: 1 Standard drinks or equivalent per week    Objective: Blood pressure 120/72, pulse 60, temperature 98.5 F (36.9 C), temperature source Oral, height 5\' 5"  (1.651 m), weight 149 lb (67.6 kg), SpO2 99 %. Body mass index is 24.79 kg/m. Constitutional: Well-appearing female in NAD GU: Chaperone present. Small pustule noted on right side of mons and scattered hyperpigmented scarring. Minimal amount of clear vaginal discharge present. No cervical motion tenderness on exam. Vitals reviewed  Assessment/Plan: Vaginal discharge - Wet prep obtained with gc/chlamydia swab. Both negative. Discussed that it can be normal to have increased discharge based on hormonal changes of the menstrual cycle. Recommended increasing yogurt intake versus taking probiotics given history of frequent BV. Also recommended urinating after  sex (at which time some ejaculate will come out of the vagina, as that could be affecting her pH).  - Discussed exfoliating and avoiding shaving for history of folliculitis  Follow-up next month as scheduled for PCP check-up. Counseled patient to think about some physical activities she might enjoy in the meantime as she decides on fitness goals.   Dani GobbleHillary Fitzgerald, MD [facility], PGY-3

## 2017-06-24 NOTE — Assessment & Plan Note (Signed)
-   Wet prep obtained with gc/chlamydia swab. Both negative. Discussed that it can be normal to have increased discharge based on hormonal changes of the menstrual cycle. Recommended increasing yogurt intake versus taking probiotics given history of frequent BV. Also recommended urinating after sex (at which time some ejaculate will come out of the vagina, as that could be affecting her pH).  - Discussed exfoliating and avoiding shaving for history of folliculitis

## 2017-07-06 ENCOUNTER — Ambulatory Visit: Payer: 59 | Admitting: Family Medicine

## 2017-07-08 ENCOUNTER — Ambulatory Visit (INDEPENDENT_AMBULATORY_CARE_PROVIDER_SITE_OTHER): Payer: 59 | Admitting: Internal Medicine

## 2017-07-08 ENCOUNTER — Encounter: Payer: Self-pay | Admitting: Internal Medicine

## 2017-07-08 VITALS — BP 110/80 | HR 84 | Temp 98.5°F | Ht 65.0 in | Wt 153.2 lb

## 2017-07-08 DIAGNOSIS — N760 Acute vaginitis: Secondary | ICD-10-CM | POA: Diagnosis not present

## 2017-07-08 DIAGNOSIS — B9689 Other specified bacterial agents as the cause of diseases classified elsewhere: Secondary | ICD-10-CM | POA: Diagnosis not present

## 2017-07-08 DIAGNOSIS — N898 Other specified noninflammatory disorders of vagina: Secondary | ICD-10-CM

## 2017-07-08 DIAGNOSIS — Z3202 Encounter for pregnancy test, result negative: Secondary | ICD-10-CM | POA: Diagnosis not present

## 2017-07-08 LAB — POCT WET PREP (WET MOUNT)
Clue Cells Wet Prep Whiff POC: POSITIVE
Trichomonas Wet Prep HPF POC: ABSENT

## 2017-07-08 LAB — POCT URINE PREGNANCY: PREG TEST UR: NEGATIVE

## 2017-07-08 MED ORDER — METRONIDAZOLE 500 MG PO TABS: 500.0000 mg | ORAL_TABLET | Freq: Two times a day (BID) | ORAL | 0 refills | Status: DC

## 2017-07-08 MED FILL — metroNIDAZOLE 500 MG TABS: 500 | 7 days supply | Qty: 14 | Fill #0

## 2017-07-08 NOTE — Progress Notes (Signed)
   [facility] [name], MD Phone: [phone]  Reason For Visit: SDA for Vaginal odor  # VAGINAL ODOR  Having vaginal odor and irritability.  Patient denies any itching.  She denies any vaginal discharge that is abnormal.  She denies any yeast infection-like symptoms.  She states that she was seen on the 16th and she still has the same issues from that appointment.  She does not want to have STDs testing as she states she just had this recently.  She said she has not been sexually active in the past 3 weeks.  She feels like she has bacterial vaginosis even though the results from her last test were negative.  She does not use any type of contraception, she does not generally use condoms.  She had her menstrual period a couple weeks ago.  She states that her periods are generally irregular Recent antibiotic use: None  Possible STD exposure:None   Symptoms Fever: None  Dysuria:None  Abdomen or Pelvic pain:None  Pain during sex: None   Missed menstrual period: None   ROS see HPI Smoking Status noted  Objective: BP 110/80 (BP Location: Right Arm, Patient Position: Sitting, Cuff Size: Normal)   Pulse 84   Temp 98.5 F (36.9 C) (Oral)   Ht  (1.651 m)   Wt 153 lb 3.2 oz (69.5 kg)   SpO2 99%   BMI 25.49 kg/m  Gen: NAD, alert, cooperative with exam Cardio: regular rate and rhythm, S1S2 heard, no murmurs appreciated Pulm: clear to auscultation bilaterally, no wheezes, rhonchi or rales GI: soft, non-tender, non-distended, bowel sounds present, no hepatomegaly, no splenomegaly   Assessment/Plan: See problem based a/p  Vaginal odor Does not screen want screening for STDs, like she still has a vaginal odor and would like to be treated for possible bacterial vaginosis she has not been sexually active recently. She does not use any type of contraception so therefore we will definitely get a urine pregnancy test.   Obtained a wet prep Will treat with  metronidazole for 7 days twice daily

## 2017-07-08 NOTE — Addendum Note (Signed)
Addended by: Gilberto Better R on: 07/08/2017 02:27 PM   Modules accepted: Orders

## 2017-07-08 NOTE — Assessment & Plan Note (Signed)
Does not screen want screening for STDs, like she still has a vaginal odor and would like to be treated for possible bacterial vaginosis she has not been sexually active recently. She does not use any type of contraception so therefore we will definitely get a urine pregnancy test.   Obtained a wet prep Will treat with metronidazole for 7 days twice daily

## 2017-07-08 NOTE — Patient Instructions (Signed)
I am going to prescribe you 7 days of metronidazole for your vaginal odor.

## 2017-09-12 ENCOUNTER — Other Ambulatory Visit: Payer: Self-pay

## 2017-09-12 ENCOUNTER — Ambulatory Visit (INDEPENDENT_AMBULATORY_CARE_PROVIDER_SITE_OTHER): Payer: 59 | Admitting: Family Medicine

## 2017-09-12 DIAGNOSIS — B009 Herpesviral infection, unspecified: Secondary | ICD-10-CM | POA: Diagnosis not present

## 2017-09-12 MED ORDER — VALACYCLOVIR HCL 1 G PO TABS
1000.0000 mg | ORAL_TABLET | Freq: Every day | ORAL | 0 refills | Status: DC
Start: 2017-09-12 — End: 2018-01-17

## 2017-09-12 MED FILL — valACYclovir HCL 1 GM TABS: 1 | 5 days supply | Qty: 5 | Fill #0

## 2017-09-12 NOTE — Progress Notes (Addendum)
   Subjective:   Patient ID: Stacy Phillips    DOB: [birth year], 27 y.o. female   MRN: [id number]  CC: bumps on vagina   HPI: Stacy Phillips is a 27 y.o. female who presents to clinic today for the following issue.  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  She was exposed to it from a previous partner about 7 years ago.  Has had flares about 2x a year and has taken Valtrex for this.  She is sexually active with males only, no known exposure to STDs.  She does not wish to be tested for these today.  Denies fever, chills, vaginal discharge or urinary symptoms.    ROS: See HPI for pertinent ROS.  Social: pt is a former smoker  Medications reviewed. Objective:   BP 118/62   Pulse 77   Temp 97.9 F (36.6 C) (Axillary)   Wt 153 lb 6.4 oz (69.6 kg)   SpO2 100%   BMI 25.53 kg/m  Vitals and nursing note reviewed.  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  Has run out of her Valtrex.  Currently with acute flare.  No recent exposures, declines STD testing today.  -counseled on safe sex - Rx: Valtrex refill provided -return precautions discussed  Meds ordered this encounter  Medications  . valACYclovir (VALTREX) 1000 MG tablet    Sig: Take 1 tablet (1,000 mg total) by mouth daily.    Dispense:  5 tablet    Refill:  0   Follow up: if symptoms worsen or do not improve  Freddrick MarchYashika Catherine Oak, MD [facility], PGY-2 09/22/2017 10:22 AM

## 2017-09-12 NOTE — Patient Instructions (Signed)
It was nice meeting you today!  You were seen in clinic for vaginal bumps which is likely an acute flare of herpes.  I have refilled your Valtrex and sent this to your pharmacy.   We discussed, for additional refills you will need to follow-up with your primary care provider.  Please call clinic if you have any new or worsening symptoms.  I am including some information about HSV type II below for you.  Be well, Freddrick March MD    Genital Herpes Genital herpes is a common sexually transmitted infection (STI) that is caused by a virus. The virus spreads from person to person through sexual contact. Infection can cause itching, blisters, and sores around the genitals or rectum. Symptoms may last several days and then go away This is called an outbreak. However, the virus remains in your body, so you may have more outbreaks in the future. The time between outbreaks varies and can be months or years. Genital herpes affects men and women. It is particularly concerning for pregnant women because the virus can be passed to the baby during delivery and can cause serious problems. Genital herpes is also a concern for people who have a weak disease-fighting (immune) system. What are the causes? This condition is caused by the herpes simplex virus (HSV) type 1 or type 2. The virus may spread through:  Sexual contact with an infected person, including vaginal, anal, and oral sex.  Contact with fluid from a herpes sore.  The skin. This means that you can get herpes from an infected partner even if he or she does not have a visible sore or does not know that he or she is infected.  What increases the risk? You are more likely to develop this condition if:  You have sex with many partners.  You do not use latex condoms during sex.  What are the signs or symptoms? Most people do not have symptoms (asymptomatic) or have mild symptoms that may be mistaken for other skin problems. Symptoms may  include:  Small red bumps near the genitals, rectum, or mouth. These bumps turn into blisters and then turn into sores.  Flu-like symptoms, including: ? Fever. ? Body aches. ? Swollen lymph nodes. ? Headache.  Painful urination.  Pain and itching in the genital area or rectal area.  Vaginal discharge.  Tingling or shooting pain in the legs and buttocks.  Generally, symptoms are more severe and last longer during the first (primary) outbreak. Flu-like symptoms are also more common during the primary outbreak. How is this diagnosed? Genital herpes may be diagnosed based on:  A physical exam.  Your medical history.  Blood tests.  A test of a fluid sample (culture) from an open sore.  How is this treated? There is no cure for this condition, but treatment with antiviral medicines that are taken by mouth (orally) can do the following:  Speed up healing and relieve symptoms.  Help to reduce the spread of the virus to sexual partners.  Limit the chance of future outbreaks, or make future outbreaks shorter.  Lessen symptoms of future outbreaks.  Your health care provider may also recommend pain relief medicines, such as aspirin or ibuprofen. Follow these instructions at home: Sexual activity  Do not have sexual contact during active outbreaks.  Practice safe sex. Latex condoms and female condoms may help prevent the spread of the herpes virus. General instructions  Keep the affected areas dry and clean.  Take over-the-counter and prescription medicines  only as told by your health care provider.  Avoid rubbing or touching blisters and sores. If you do touch blisters or sores: ? Wash your hands thoroughly with soap and water. ? Do not touch your eyes afterward.  To help relieve pain or itching, you may take the following actions as directed by your health care provider: ? Apply a cold, wet cloth (cold compress) to affected areas 4-6 times a day. ? Apply a substance  that protects your skin and reduces bleeding (astringent). ? Apply a gel that helps relieve pain around sores (lidocaine gel). ? Take a warm, shallow bath that cleans the genital area (sitz bath).  Keep all follow-up visits as told by your health care provider. This is important. How is this prevented?  Use condoms. Although anyone can get genital herpes during sexual contact, even with the use of a condom, a condom can provide some protection.  Avoid having multiple sexual partners.  Talk with your sexual partner about any symptoms either of you may have. Also, talk with your partner about any history of STIs.  Get tested for STIs before you have sex. Ask your partner to do the same.  Do not have sexual contact if you have symptoms of genital herpes. Contact a health care provider if:  Your symptoms are not improving with medicine.  Your symptoms return.  You have new symptoms.  You have a fever.  You have abdominal pain.  You have redness, swelling, or pain in your eye.  You notice new sores on other parts of your body.  You are a woman and experience bleeding between menstrual periods.  You have had herpes and you become pregnant or plan to become pregnant. Summary  Genital herpes is a common sexually transmitted infection (STI) that is caused by the herpes simplex virus (HSV) type 1 or type 2.  These viruses are most often spread through sexual contact with an infected person.  You are more likely to develop this condition if you have sex with many partners or you have unprotected sex.  Most people do not have symptoms (asymptomatic) or have mild symptoms that may be mistaken for other skin problems. Symptoms occur as outbreaks that may happen months or years apart.  There is no cure for this condition, but treatment with oral antiviral medicines can reduce symptoms, reduce the chance of spreading the virus to a partner, prevent future outbreaks, or shorten future  outbreaks. This information is not intended to replace advice given to you by your health care provider. Make sure you discuss any questions you have with your health care provider. Document Released: 02/20/2000 Document Revised: 01/23/2016 Document Reviewed: 01/23/2016 Elsevier Interactive Patient Education  [employer]2018 [employer] Inc.

## 2017-09-22 NOTE — Assessment & Plan Note (Signed)
H/o HSV-2 exposure about 7 years ago.  Has run out of her Valtrex.  Currently with acute flare.  No recent exposures, declines STD testing today.  -counseled on safe sex - Rx: Valtrex refill provided -return precautions discussed

## 2017-10-04 ENCOUNTER — Other Ambulatory Visit: Payer: Self-pay | Admitting: Family Medicine

## 2017-10-04 DIAGNOSIS — N760 Acute vaginitis: Principal | ICD-10-CM

## 2017-10-04 DIAGNOSIS — B9689 Other specified bacterial agents as the cause of diseases classified elsewhere: Secondary | ICD-10-CM

## 2017-10-04 MED ORDER — FLUCONAZOLE 150 MG PO TABS: ORAL_TABLET | ORAL | 0 refills | Status: DC

## 2017-10-04 MED ORDER — METRONIDAZOLE 500 MG PO TABS: 500.0000 mg | ORAL_TABLET | Freq: Two times a day (BID) | ORAL | 0 refills | Status: DC

## 2017-10-04 NOTE — Telephone Encounter (Signed)
Pt needs to get a refill on her BV medication. She wants the 7 day pills and then the one day for the yeast without having to be seen.  Its the same pharmacy she also uses. It is on [address]. Please advise

## 2017-10-04 NOTE — Telephone Encounter (Signed)
Metronidazole and diflucan sent to patient's pharmacy on [address].

## 2017-10-05 NOTE — Telephone Encounter (Signed)
Tried to contact pt to inform her of below.  VM has not been set up.  If she calls back please inform her of below. Lamonte SakaiZimmerman Rumple, April D, [place]

## 2017-10-25 ENCOUNTER — Other Ambulatory Visit: Payer: Self-pay

## 2017-10-25 ENCOUNTER — Other Ambulatory Visit (HOSPITAL_COMMUNITY)
Admission: RE | Admit: 2017-10-25 | Discharge: 2017-10-25 | Disposition: A | Discharge status: Home / Self Care | Payer: 59 | Source: Ambulatory Visit | Attending: Family Medicine | Admitting: Family Medicine

## 2017-10-25 ENCOUNTER — Ambulatory Visit (INDEPENDENT_AMBULATORY_CARE_PROVIDER_SITE_OTHER): Payer: 59 | Admitting: Family Medicine

## 2017-10-25 VITALS — BP 124/82 | HR 74 | Ht 65.0 in | Wt 156.0 lb

## 2017-10-25 DIAGNOSIS — N898 Other specified noninflammatory disorders of vagina: Secondary | ICD-10-CM

## 2017-10-25 LAB — POCT WET PREP (WET MOUNT)
CLUE CELLS WET PREP WHIFF POC: POSITIVE
TRICHOMONAS WET PREP HPF POC: ABSENT

## 2017-10-25 MED ORDER — METRONIDAZOLE 500 MG PO TABS: 500.0000 mg | ORAL_TABLET | Freq: Two times a day (BID) | ORAL | 0 refills | Status: AC

## 2017-10-25 MED FILL — metroNIDAZOLE 500 MG TABS: 500 | 7 days supply | Qty: 14 | Fill #0

## 2017-10-25 NOTE — Patient Instructions (Signed)
Bacterial Vaginosis Bacterial vaginosis is a vaginal infection that occurs when the normal balance of bacteria in the vagina is disrupted. It results from an overgrowth of certain bacteria. This is the most common vaginal infection among women ages 15-44. Because bacterial vaginosis increases your risk for STIs (sexually transmitted infections), getting treated can help reduce your risk for chlamydia, gonorrhea, herpes, and HIV (human immunodeficiency virus). Treatment is also important for preventing complications in pregnant women, because this condition can cause an early (premature) delivery. What are the causes? This condition is caused by an increase in harmful bacteria that are normally present in small amounts in the vagina. However, the reason that the condition develops is not fully understood. What increases the risk? The following factors may make you more likely to develop this condition:  Having a new sexual partner or multiple sexual partners.  Having unprotected sex.  Douching.  Having an intrauterine device (IUD).  Smoking.  Drug and alcohol abuse.  Taking certain antibiotic medicines.  Being pregnant.  You cannot get bacterial vaginosis from toilet seats, bedding, swimming pools, or contact with objects around you. What are the signs or symptoms? Symptoms of this condition include:  Grey or white vaginal discharge. The discharge can also be watery or foamy.  A fish-like odor with discharge, especially after sexual intercourse or during menstruation.  Itching in and around the vagina.  Burning or pain with urination.  Some women with bacterial vaginosis have no signs or symptoms. How is this diagnosed? This condition is diagnosed based on:  Your medical history.  A physical exam of the vagina.  Testing a sample of vaginal fluid under a microscope to look for a large amount of bad bacteria or abnormal cells. Your health care provider may use a cotton swab  or a small wooden spatula to collect the sample.  How is this treated? This condition is treated with antibiotics. These may be given as a pill, a vaginal cream, or a medicine that is put into the vagina (suppository). If the condition comes back after treatment, a second round of antibiotics may be needed. Follow these instructions at home: Medicines  Take over-the-counter and prescription medicines only as told by your health care provider.  Take or use your antibiotic as told by your health care provider. Do not stop taking or using the antibiotic even if you start to feel better. General instructions  If you have a female sexual partner, tell her that you have a vaginal infection. She should see her health care provider and be treated if she has symptoms. If you have a female sexual partner, he does not need treatment.  During treatment: ? Avoid sexual activity until you finish treatment. ? Do not douche. ? Avoid alcohol as directed by your health care provider. ? Avoid breastfeeding as directed by your health care provider.  Drink enough water and fluids to keep your urine clear or pale yellow.  Keep the area around your vagina and rectum clean. ? Wash the area daily with warm water. ? Wipe yourself from front to back after using the toilet.  Keep all follow-up visits as told by your health care provider. This is important. How is this prevented?  Do not douche.  Wash the outside of your vagina with warm water only.  Use protection when having sex. This includes latex condoms and dental dams.  Limit how many sexual partners you have. To help prevent bacterial vaginosis, it is best to have sex with just   one partner (monogamous).  Make sure you and your sexual partner are tested for STIs.  Wear cotton or cotton-lined underwear.  Avoid wearing tight pants and pantyhose, especially during summer.  Limit the amount of alcohol that you drink.  Do not use any products that  contain nicotine or tobacco, such as cigarettes and e-cigarettes. If you need help quitting, ask your health care provider.  Do not use illegal drugs. Where to find more information:  Centers for Disease Control and Prevention: www.cdc.gov/std  American Sexual Health Association (ASHA): www.ashastd.org  U.S. Department of Health and Human Services, Office on Women's Health: www.womenshealth.gov/ or https://www.womenshealth.gov/a-z-topics/bacterial-vaginosis Contact a health care provider if:  Your symptoms do not improve, even after treatment.  You have more discharge or pain when urinating.  You have a fever.  You have pain in your abdomen.  You have pain during sex.  You have vaginal bleeding between periods. Summary  Bacterial vaginosis is a vaginal infection that occurs when the normal balance of bacteria in the vagina is disrupted.  Because bacterial vaginosis increases your risk for STIs (sexually transmitted infections), getting treated can help reduce your risk for chlamydia, gonorrhea, herpes, and HIV (human immunodeficiency virus). Treatment is also important for preventing complications in pregnant women, because the condition can cause an early (premature) delivery.  This condition is treated with antibiotic medicines. These may be given as a pill, a vaginal cream, or a medicine that is put into the vagina (suppository). This information is not intended to replace advice given to you by your health care provider. Make sure you discuss any questions you have with your health care provider. Document Released: 02/22/2005 Document Revised: 06/28/2016 Document Reviewed: 11/08/2015 Elsevier Interactive Patient Education  2018 [employer]  

## 2017-10-25 NOTE — Progress Notes (Signed)
SUBJECTIVE:  27 y.o. female complains of white vaginal discharge for 2 week(s). Denies abnormal vaginal bleeding or significant pelvic pain or fever. No UTI symptoms. Denies history of known exposure to STD.  No LMP recorded. (Menstrual status: Irregular Periods).  OBJECTIVE:  She appears well, afebrile. Abdomen: benign, soft, nontender, no masses. Pelvic Exam: normal external genitalia, vulva, vagina, cervix, uterus and adnexa, exam declined by the patient. Self swab Urine dipstick: not done.  ASSESSMENT:  bacterial vaginosis  PLAN:  Treatment: Flagyl 500 BID x 7 days and abstain from coitus during course of treatment ROV prn if symptoms persist or worsen.

## 2017-10-26 LAB — CERVICOVAGINAL ANCILLARY ONLY
CHLAMYDIA, DNA PROBE: NEGATIVE
Neisseria Gonorrhea: NEGATIVE

## 2017-12-28 MED FILL — metroNIDAZOLE 500 MG TABS: 500 | 7 days supply | Qty: 14 | Fill #0

## 2018-01-17 ENCOUNTER — Other Ambulatory Visit: Payer: Self-pay

## 2018-01-17 NOTE — Telephone Encounter (Signed)
Patient called. Recently took the course of Flagyl previously prescribed. Now has yeast infection. Requesting refill on Diflucan and also on Valtrex. [facility] [place].  Call back [phone]  Ples SpecterAlisa Chanah Tidmore, RN [facility] RN)

## 2018-01-18 MED ORDER — FLUCONAZOLE 150 MG PO TABS
ORAL_TABLET | ORAL | 0 refills | Status: DC
Start: 2018-01-18 — End: 2018-09-14

## 2018-01-18 MED ORDER — VALACYCLOVIR HCL 1 G PO TABS: 1000.0000 mg | ORAL_TABLET | Freq: Every day | ORAL | 0 refills | Status: DC

## 2018-02-10 DIAGNOSIS — N911 Secondary amenorrhea: Secondary | ICD-10-CM | POA: Diagnosis not present

## 2018-02-10 DIAGNOSIS — Z113 Encounter for screening for infections with a predominantly sexual mode of transmission: Secondary | ICD-10-CM | POA: Diagnosis not present

## 2018-02-10 DIAGNOSIS — N898 Other specified noninflammatory disorders of vagina: Secondary | ICD-10-CM | POA: Diagnosis not present

## 2018-02-10 DIAGNOSIS — Z1272 Encounter for screening for malignant neoplasm of vagina: Secondary | ICD-10-CM | POA: Diagnosis not present

## 2018-04-20 ENCOUNTER — Other Ambulatory Visit: Payer: Self-pay | Admitting: Family Medicine

## 2018-04-20 ENCOUNTER — Other Ambulatory Visit: Payer: Self-pay | Admitting: *Deleted

## 2018-04-20 NOTE — Telephone Encounter (Signed)
Pt is currently having a breakout. Stacy Phillips, Maryjo [place], CMA

## 2018-05-26 MED FILL — valACYclovir HCL 1 GM TABS: 1 | 5 days supply | Qty: 5 | Fill #0

## 2018-06-14 ENCOUNTER — Other Ambulatory Visit: Payer: Self-pay | Admitting: Family Medicine

## 2018-06-14 DIAGNOSIS — N760 Acute vaginitis: Principal | ICD-10-CM

## 2018-06-14 DIAGNOSIS — B9689 Other specified bacterial agents as the cause of diseases classified elsewhere: Secondary | ICD-10-CM

## 2018-06-15 ENCOUNTER — Other Ambulatory Visit: Payer: Self-pay | Admitting: Family Medicine

## 2018-06-15 DIAGNOSIS — B9689 Other specified bacterial agents as the cause of diseases classified elsewhere: Secondary | ICD-10-CM

## 2018-06-15 DIAGNOSIS — N760 Acute vaginitis: Principal | ICD-10-CM

## 2018-06-23 MED FILL — METRONIDAZOLE 500 MG TABS: 500 | 7 days supply | Qty: 14 | Fill #0

## 2018-08-10 ENCOUNTER — Other Ambulatory Visit: Payer: Self-pay | Admitting: Family Medicine

## 2018-08-11 MED FILL — valACYclovir HCL 1 GM TABS: 1 | 5 days supply | Qty: 5 | Fill #0

## 2018-08-17 ENCOUNTER — Other Ambulatory Visit: Payer: Self-pay | Admitting: Family Medicine

## 2018-08-17 DIAGNOSIS — N760 Acute vaginitis: Secondary | ICD-10-CM

## 2018-08-17 DIAGNOSIS — B9689 Other specified bacterial agents as the cause of diseases classified elsewhere: Secondary | ICD-10-CM

## 2018-09-13 ENCOUNTER — Other Ambulatory Visit: Payer: Self-pay | Admitting: Family Medicine

## 2018-09-13 DIAGNOSIS — B9689 Other specified bacterial agents as the cause of diseases classified elsewhere: Secondary | ICD-10-CM

## 2018-09-14 ENCOUNTER — Other Ambulatory Visit: Payer: Self-pay | Admitting: Family Medicine

## 2018-09-14 MED ORDER — FLUCONAZOLE 150 MG PO TABS: ORAL_TABLET | ORAL | 0 refills | Status: DC

## 2018-09-21 MED FILL — FLUCONAZOLE 150 MG TABS: 150 | 1 days supply | Qty: 1 | Fill #0

## 2019-01-16 ENCOUNTER — Other Ambulatory Visit: Payer: Self-pay | Admitting: Family Medicine
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
## 2019-05-07 ENCOUNTER — Other Ambulatory Visit: Payer: Self-pay

## 2019-05-07 MED ORDER — FLUCONAZOLE 150 MG PO TABS: ORAL_TABLET | ORAL | 0 refills | Status: DC

## 2019-05-09 ENCOUNTER — Other Ambulatory Visit: Payer: Self-pay

## 2019-05-09 MED ORDER — VALACYCLOVIR HCL 1 G PO TABS: 1000.0000 mg | ORAL_TABLET | Freq: Every day | ORAL | 0 refills | Status: AC

## 2019-05-09 MED FILL — valACYclovir HCL 1 GM TABS: 1 | 5 days supply | Qty: 5 | Fill #0

## 2019-05-16 ENCOUNTER — Other Ambulatory Visit: Payer: Self-pay | Admitting: Family Medicine

## 2019-05-17 MED FILL — FLUCONAZOLE 150 MG TABS: 150 | 1 days supply | Qty: 1 | Fill #0
